# Patient Record
Sex: Female | Born: 1990 | Race: Black or African American | Hispanic: No | Marital: Single | Smoking: Never smoker
Health system: Southern US, Community
[De-identification: ages and names within clinical notes are randomized; demographics above are authoritative.]

## PROBLEM LIST (undated history)

## (undated) HISTORY — PX: NO PAST SURGERIES: SHX2092

---

## 2012-03-13 ENCOUNTER — Emergency Department (INDEPENDENT_AMBULATORY_CARE_PROVIDER_SITE_OTHER)
Admission: EM | Admit: 2012-03-13 | Discharge: 2012-03-13 | Disposition: A | Discharge status: Home / Self Care | Payer: PRIVATE HEALTH INSURANCE | Source: Home / Self Care | Attending: Family Medicine | Admitting: Family Medicine

## 2012-03-13 ENCOUNTER — Encounter (HOSPITAL_COMMUNITY): Payer: Self-pay | Admitting: Emergency Medicine

## 2012-03-13 DIAGNOSIS — L250 Unspecified contact dermatitis due to cosmetics: Secondary | ICD-10-CM

## 2012-03-13 MED ORDER — FLUTICASONE PROPIONATE 0.05 % EX CREA: TOPICAL_CREAM | Freq: Two times a day (BID) | CUTANEOUS | Status: DC

## 2012-03-13 NOTE — ED Notes (Signed)
Pt c/o rash x3 days... Rash is around bilateral eyes, behind left ear, and around mouth... Denies: fevers, vomiting, nauseas, diarrhea... Also denies: any new hygiene products, new foods... She is alert w/no signs of distress.

## 2012-03-13 NOTE — ED Provider Notes (Signed)
History     CSN: [id number]  Arrival date & time 03/13/12  0917   First MD Initiated Contact with Patient 03/13/12 [phone]      Chief Complaint  Patient presents with  . Rash    (Consider location/radiation/quality/duration/timing/severity/associated sxs/prior treatment) Patient is a 21 y.o. female presenting with rash. The history is provided by the patient.  Rash  This is a new problem. The current episode started 2 days ago. The problem has not changed since onset.The problem is associated with an unknown factor. There has been no fever. The rash is present on the face, lips and neck. The patient is experiencing no pain. The pain has been constant since onset. Associated symptoms include blisters and itching.    History reviewed. No pertinent past medical history.  History reviewed. No pertinent past surgical history.  No family history on file.  History  Substance Use Topics  . Smoking status: Never Smoker   . Smokeless tobacco: Not on file  . Alcohol Use: Yes    OB History    Grav Para Term Preterm Abortions TAB SAB Ect Mult Living                  Review of Systems  Constitutional: Negative.   HENT: Negative.   Skin: Positive for itching and rash.    Allergies  Review of patient's allergies indicates no known allergies.  Home Medications   Current Outpatient Rx  Name  Route  Sig  Dispense  Refill  . FLUTICASONE PROPIONATE 0.05 % EX CREA   Topical   Apply topically 2 (two) times daily. To rash areas   30 g   0     BP 99/61  Pulse 73  Temp 98.3 F (36.8 C) (Oral)  Resp 20  SpO2 97%  LMP 02/16/2012  Physical Exam  Vitals reviewed. Constitutional: She is oriented to person, place, and time. She appears well-developed and well-nourished.  HENT:  Head: Normocephalic.  Right Ear: External ear normal.  Mouth/Throat: Oropharynx is clear and moist.  Eyes: Pupils are equal, round, and reactive to light.  Neck: Normal range of motion. Neck supple.   Neurological: She is alert and oriented to person, place, and time.  Skin: Skin is warm and dry. Rash noted.       Fine pruritic papular/vesicular rash on upper lids, and lip and left neck.    ED Course  Procedures (including critical care time)  Labs Reviewed - No data to display No results found.   1. Contact dermatitis due to cosmetics       MDM          Linna Hoff, MD 03/13/12 1035

## 2012-05-08 ENCOUNTER — Emergency Department (INDEPENDENT_AMBULATORY_CARE_PROVIDER_SITE_OTHER)
Admission: EM | Admit: 2012-05-08 | Discharge: 2012-05-08 | Disposition: A | Discharge status: Home / Self Care | Payer: PRIVATE HEALTH INSURANCE | Source: Home / Self Care

## 2012-05-08 ENCOUNTER — Other Ambulatory Visit (HOSPITAL_COMMUNITY)
Admission: RE | Admit: 2012-05-08 | Discharge: 2012-05-08 | Disposition: A | Discharge status: Home / Self Care | Payer: PRIVATE HEALTH INSURANCE | Source: Ambulatory Visit | Attending: Emergency Medicine | Admitting: Emergency Medicine

## 2012-05-08 ENCOUNTER — Encounter (HOSPITAL_COMMUNITY): Payer: Self-pay

## 2012-05-08 DIAGNOSIS — N760 Acute vaginitis: Secondary | ICD-10-CM | POA: Insufficient documentation

## 2012-05-08 DIAGNOSIS — Z113 Encounter for screening for infections with a predominantly sexual mode of transmission: Secondary | ICD-10-CM | POA: Insufficient documentation

## 2012-05-08 DIAGNOSIS — A499 Bacterial infection, unspecified: Secondary | ICD-10-CM

## 2012-05-08 DIAGNOSIS — B9689 Other specified bacterial agents as the cause of diseases classified elsewhere: Secondary | ICD-10-CM

## 2012-05-08 LAB — POCT URINALYSIS DIP (DEVICE)
Bilirubin Urine: NEGATIVE
Glucose, UA: NEGATIVE mg/dL
Ketones, ur: 15 mg/dL — AB
Nitrite: NEGATIVE
Specific Gravity, Urine: 1.02 (ref 1.005–1.030)
pH: 7 (ref 5.0–8.0)

## 2012-05-08 MED ORDER — METRONIDAZOLE 500 MG PO TABS: 500.0000 mg | ORAL_TABLET | Freq: Two times a day (BID) | ORAL | Status: DC

## 2012-05-08 NOTE — ED Provider Notes (Signed)
Medical screening examination/treatment/procedure(s) were performed by resident physician or non-physician practitioner and as supervising physician I was immediately available for consultation/collaboration.   KINDL,JAMES DOUGLAS MD.    James D Kindl, MD 05/08/12 2031 

## 2012-05-08 NOTE — ED Provider Notes (Signed)
History     CSN: [id number]  Arrival date & time 05/08/12  1106   None     Chief Complaint  Patient presents with  . Vaginal Itching    (Consider location/radiation/quality/duration/timing/severity/associated sxs/prior treatment) HPI Comments: 22 year old female presents with vaginal irritation that began last night. She states that she uses condoms for contraception and alleges that she may have had an allergic reaction from that. However, she has been using condoms for over a couple years. She is complaining of itchiness in the perineum and vulva and she felt last night and she saw some redness. This morning she awoke and discovered scant amount of vaginal discharge. She has had minor and somewhat equivocal cramping in the suprapubis. She denies abdominal pain, fever, chills, chest pain, shortness of breath nausea or vomiting, diarrhea or urinary symptoms. Last menstrual period was in the last week of December.   History reviewed. No pertinent past medical history.  History reviewed. No pertinent past surgical history.  History reviewed. No pertinent family history.  History  Substance Use Topics  . Smoking status: Never Smoker   . Smokeless tobacco: Not on file  . Alcohol Use: Yes    OB History    Grav Para Term Preterm Abortions TAB SAB Ect Mult Living                  Review of Systems  Constitutional: Negative.   Respiratory: Negative.   Cardiovascular: Negative.   Gastrointestinal: Negative.   Genitourinary: Positive for vaginal discharge and vaginal pain. Negative for dysuria, urgency, frequency, hematuria, flank pain, vaginal bleeding, difficulty urinating and menstrual problem.  Musculoskeletal: Negative.   Skin: Negative.   Neurological: Negative.   Hematological: Negative.   Psychiatric/Behavioral: Negative.     Allergies  Review of patient's allergies indicates no known allergies.  Home Medications   Current Outpatient Rx  Name  Route  Sig   Dispense  Refill  . FLUTICASONE PROPIONATE 0.05 % EX CREA   Topical   Apply topically 2 (two) times daily. To rash areas   30 g   0   . METRONIDAZOLE 500 MG PO TABS   Oral   Take 1 tablet (500 mg total) by mouth 2 (two) times daily. X 7 days   14 tablet   0     BP 100/67  Pulse 80  Temp 97.3 F (36.3 C) (Oral)  Resp 18  SpO2 100%  Physical Exam  Nursing note and vitals reviewed. Constitutional: She is oriented to person, place, and time. She appears well-developed and well-nourished.  Eyes: Conjunctivae normal and EOM are normal.  Neck: Normal range of motion. Neck supple.  Cardiovascular: Normal rate.   Pulmonary/Chest: Effort normal.  Abdominal: Soft. Bowel sounds are normal. She exhibits no distension and no mass. There is no tenderness. There is no rebound and no guarding.  Genitourinary: Vaginal discharge found.       External exam of the pelvis reveals mild suprapubic tenderness. No tenderness over the bilateral pelvis. Normal external female genitalia. They are no observed lesions or discoloration of the vulva. The patient denies tenderness with palpation. Cervix is to the right of the midline and posterior. The ectocervix had patchy erythema but not friable. The os is nulliparous. There is a scant amount of thin gray vaginal discharge. Bimanual: No CMT or adnexal tenderness. AFirm andAptiva  swabs were obtained  Musculoskeletal: Normal range of motion. She exhibits no edema and no tenderness.  Neurological: She is alert and oriented  to person, place, and time.  Skin: Skin is warm. No rash noted.  Psychiatric: She has a normal mood and affect.    ED Course  Procedures (including critical care time)  Labs Reviewed  POCT URINALYSIS DIP (DEVICE) - Abnormal; Notable for the following:    Ketones, ur 15 (*)     Hgb urine dipstick TRACE (*)     Protein, ur 30 (*)     Leukocytes, UA TRACE (*)  Biochemical Testing Only. Please order routine urinalysis from main lab if  confirmatory testing is needed.   All other components within normal limits   No results found.   1. BV (bacterial vaginosis)   2. Vulvovaginitis       MDM  Flagyl 500 bid for 7 d. Obtain Affirm/Aptiva swabs. Will call results and treat accordingly.  May use Vagisil  OTC topically for discomfort. If persistent may try monistat cream.          Hayden Rasmussen, NP 05/08/12 1255

## 2012-05-08 NOTE — ED Notes (Signed)
C/o vaginal irritation & d/c since last PM; stated her perineal area was red when she looked w mirror; states episodic cramping lower abdominal area

## 2012-05-21 ENCOUNTER — Telehealth (HOSPITAL_COMMUNITY): Payer: Self-pay | Admitting: *Deleted

## 2012-05-21 NOTE — ED Notes (Signed)
2/4  GC/Chlamydia neg.,  Affirm: Candida pos., Gardnerella and Trich neg.  Message to Hayden Rasmussen NP.  He wrote for pt. to try OTC Monistat.  2/12 I called pt. Pt. verified x 2 and given results.  Pt. given instructions to use Monistat and how to use it.  Pt. voiced understanding. Vassie Moselle 05/21/2012

## 2014-11-26 ENCOUNTER — Emergency Department (HOSPITAL_COMMUNITY)
Admission: EM | Admit: 2014-11-26 | Discharge: 2014-11-27 | Disposition: A | Discharge status: Home / Self Care | Payer: No Typology Code available for payment source | Attending: Emergency Medicine | Admitting: Emergency Medicine

## 2014-11-26 ENCOUNTER — Encounter (HOSPITAL_COMMUNITY): Payer: Self-pay | Admitting: Emergency Medicine

## 2014-11-26 ENCOUNTER — Emergency Department (HOSPITAL_COMMUNITY): Payer: No Typology Code available for payment source

## 2014-11-26 DIAGNOSIS — S0990XA Unspecified injury of head, initial encounter: Secondary | ICD-10-CM | POA: Diagnosis not present

## 2014-11-26 DIAGNOSIS — Y998 Other external cause status: Secondary | ICD-10-CM | POA: Insufficient documentation

## 2014-11-26 DIAGNOSIS — M545 Low back pain: Secondary | ICD-10-CM

## 2014-11-26 DIAGNOSIS — Z79899 Other long term (current) drug therapy: Secondary | ICD-10-CM | POA: Diagnosis not present

## 2014-11-26 DIAGNOSIS — Y92410 Unspecified street and highway as the place of occurrence of the external cause: Secondary | ICD-10-CM | POA: Diagnosis not present

## 2014-11-26 DIAGNOSIS — Y9389 Activity, other specified: Secondary | ICD-10-CM | POA: Insufficient documentation

## 2014-11-26 DIAGNOSIS — S3992XA Unspecified injury of lower back, initial encounter: Secondary | ICD-10-CM | POA: Insufficient documentation

## 2014-11-26 DIAGNOSIS — S161XXA Strain of muscle, fascia and tendon at neck level, initial encounter: Secondary | ICD-10-CM | POA: Diagnosis not present

## 2014-11-26 DIAGNOSIS — S199XXA Unspecified injury of neck, initial encounter: Secondary | ICD-10-CM | POA: Diagnosis present

## 2014-11-26 MED ORDER — CYCLOBENZAPRINE HCL 5 MG PO TABS: 5.0000 mg | ORAL_TABLET | Freq: Three times a day (TID) | ORAL | Status: DC | PRN

## 2014-11-26 MED ORDER — HYDROCODONE-ACETAMINOPHEN 5-325 MG PO TABS
1.0000 | ORAL_TABLET | Freq: Once | ORAL | Status: AC
  Administered 2014-11-26: 1 via ORAL
  Filled 2014-11-26: qty 1

## 2014-11-26 MED ORDER — IBUPROFEN 600 MG PO TABS: 600.0000 mg | ORAL_TABLET | Freq: Three times a day (TID) | ORAL | Status: DC
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
## 2014-11-26 NOTE — ED Notes (Signed)
Patient transported to X-ray 

## 2015-03-29 LAB — OB RESULTS CONSOLE HIV ANTIBODY (ROUTINE TESTING): HIV: NONREACTIVE

## 2015-03-29 LAB — OB RESULTS CONSOLE RPR: RPR: NONREACTIVE

## 2015-03-29 LAB — OB RESULTS CONSOLE RUBELLA ANTIBODY, IGM: Rubella: IMMUNE

## 2015-03-29 LAB — OB RESULTS CONSOLE HEPATITIS B SURFACE ANTIGEN: Hepatitis B Surface Ag: NEGATIVE

## 2015-04-09 NOTE — L&D Delivery Note (Signed)
Called to room around midnight to evaluate pt due to an increasing amount of pressure and strong urge to push. Cvx noted to be 7/90/0 (no longer edematous). She was repositioned and peanut placed. Pitocin at 4 mU/min, reduced to 2 as MVUs were above 200 and ctxs were q 1-2 min. FHRT remained reassuring throughout. Pt visibly uncomfortable w/ each ctx. Cvx re-examined and noted to be AL/C/+1, dilating to 10 within minutes. Few elevated BPs, all taken during a ctx and pt's increased level of discomfort. She was repositioned in Lithotomy and formal pushing begun. Pt very warm to the touch, axillary temp 100.7 at 00:10. IV Unasyn 3 gm was given at 2238 --- will continue med after delivery for 24 hrs. H/A earlier in the evening relieved by Tylenol. Delivery occurred after only a few pushes. Details as follows:  Delivery Note At 1:03 AM a viable female "Layana" was delivered via Vaginal, Spontaneous Delivery (Presentation: OA restituting to Right Occiput Anterior). APGARS: 8, 9; weight 6 lbs 11.4 oz (3045 g).   Placenta status: Intact, Spontaneous Schultz. Cord: 3 vessels with the following complications: None.  Cord pH: NA  Anesthesia: Epidural  Episiotomy: None Lacerations: 2nd degree Vaginal/perineal Suture Repair: 3.0 chromic CT and SH Est. Blood Loss (mL): 300  Mom to postpartum.  Baby to Couplet care / Skin to Skin.  Mom plans to breastfeed.  Desires Mirena IUD for contraception.   Sherre Scarlet 10/28/2015, 2:21 AM

## 2015-04-13 LAB — OB RESULTS CONSOLE GC/CHLAMYDIA
CHLAMYDIA, DNA PROBE: NEGATIVE
GC PROBE AMP, GENITAL: NEGATIVE

## 2015-10-04 LAB — OB RESULTS CONSOLE GBS: GBS: NEGATIVE

## 2015-10-27 ENCOUNTER — Inpatient Hospital Stay (HOSPITAL_COMMUNITY): Payer: Medicaid Other | Admitting: Anesthesiology

## 2015-10-27 ENCOUNTER — Inpatient Hospital Stay (HOSPITAL_COMMUNITY)
Admission: AD | Admit: 2015-10-27 | Discharge: 2015-10-30 | DRG: 774 | Disposition: A | Discharge status: Home / Self Care | Payer: Medicaid Other | Source: Ambulatory Visit | Attending: Obstetrics and Gynecology | Admitting: Obstetrics and Gynecology

## 2015-10-27 ENCOUNTER — Encounter (HOSPITAL_COMMUNITY): Payer: Self-pay | Admitting: *Deleted

## 2015-10-27 DIAGNOSIS — Z3A39 39 weeks gestation of pregnancy: Secondary | ICD-10-CM | POA: Diagnosis not present

## 2015-10-27 DIAGNOSIS — O9832 Other infections with a predominantly sexual mode of transmission complicating childbirth: Secondary | ICD-10-CM | POA: Diagnosis present

## 2015-10-27 DIAGNOSIS — O411230 Chorioamnionitis, third trimester, not applicable or unspecified: Secondary | ICD-10-CM | POA: Diagnosis present

## 2015-10-27 DIAGNOSIS — IMO0001 Reserved for inherently not codable concepts without codable children: Secondary | ICD-10-CM

## 2015-10-27 DIAGNOSIS — A6000 Herpesviral infection of urogenital system, unspecified: Secondary | ICD-10-CM | POA: Diagnosis present

## 2015-10-27 LAB — CBC
HCT: 40.1 % (ref 36.0–46.0)
Hemoglobin: 13.8 g/dL (ref 12.0–15.0)
MCH: 33.6 pg (ref 26.0–34.0)
MCHC: 34.4 g/dL (ref 30.0–36.0)
MCV: 97.6 fL (ref 78.0–100.0)
Platelets: 192 10*3/uL (ref 150–400)
RBC: 4.11 MIL/uL (ref 3.87–5.11)
RDW: 13.3 % (ref 11.5–15.5)
WBC: 15.2 10*3/uL — ABNORMAL HIGH (ref 4.0–10.5)

## 2015-10-27 LAB — TYPE AND SCREEN
ABO/RH(D): O POS
Antibody Screen: NEGATIVE

## 2015-10-27 LAB — ABO/RH: ABO/RH(D): O POS

## 2015-10-27 MED ORDER — PHENYLEPHRINE 40 MCG/ML (10ML) SYRINGE FOR IV PUSH (FOR BLOOD PRESSURE SUPPORT): 80.0000 ug | PREFILLED_SYRINGE | INTRAVENOUS | Status: DC | PRN

## 2015-10-27 MED ORDER — EPHEDRINE 5 MG/ML INJ
10.0000 mg | INTRAVENOUS | Status: DC | PRN
  Filled 2015-10-27: qty 2

## 2015-10-27 MED ORDER — FENTANYL CITRATE (PF) 100 MCG/2ML IJ SOLN
50.0000 ug | INTRAMUSCULAR | Status: DC | PRN
  Administered 2015-10-27: 100 ug via INTRAVENOUS
  Filled 2015-10-27: qty 2

## 2015-10-27 MED ORDER — OXYCODONE-ACETAMINOPHEN 5-325 MG PO TABS: 2.0000 | ORAL_TABLET | ORAL | Status: DC | PRN

## 2015-10-27 MED ORDER — LIDOCAINE HCL (PF) 1 % IJ SOLN
30.0000 mL | INTRAMUSCULAR | Status: AC | PRN
  Administered 2015-10-28: 30 mL via SUBCUTANEOUS
  Filled 2015-10-27: qty 30

## 2015-10-27 MED ORDER — LACTATED RINGERS IV SOLN: 500.0000 mL | INTRAVENOUS | Status: DC | PRN

## 2015-10-27 MED ORDER — LACTATED RINGERS IV SOLN
500.0000 mL | Freq: Once | INTRAVENOUS | Status: AC
  Administered 2015-10-27: 500 mL via INTRAVENOUS

## 2015-10-27 MED ORDER — LACTATED RINGERS IV SOLN
INTRAVENOUS | Status: DC
Start: 2015-10-27 — End: 2015-10-28
  Administered 2015-10-27 (×3): via INTRAVENOUS

## 2015-10-27 MED ORDER — LIDOCAINE HCL (PF) 1 % IJ SOLN
INTRAMUSCULAR | Status: DC | PRN
  Administered 2015-10-27 (×2): 4 mL via EPIDURAL

## 2015-10-27 MED ORDER — TERBUTALINE SULFATE 1 MG/ML IJ SOLN
0.2500 mg | Freq: Once | INTRAMUSCULAR | Status: DC | PRN
  Filled 2015-10-27: qty 1

## 2015-10-27 MED ORDER — OXYTOCIN BOLUS FROM INFUSION
500.0000 mL | INTRAVENOUS | Status: DC
  Administered 2015-10-28: 500 mL via INTRAVENOUS

## 2015-10-27 MED ORDER — SOD CITRATE-CITRIC ACID 500-334 MG/5ML PO SOLN: 30.0000 mL | ORAL | Status: DC | PRN

## 2015-10-27 MED ORDER — ACETAMINOPHEN 325 MG PO TABS
650.0000 mg | ORAL_TABLET | ORAL | Status: DC | PRN
  Administered 2015-10-27: 650 mg via ORAL
  Filled 2015-10-27: qty 2

## 2015-10-27 MED ORDER — LACTATED RINGERS IV SOLN
INTRAVENOUS | Status: DC
  Administered 2015-10-27: 250 mL via INTRAUTERINE

## 2015-10-27 MED ORDER — EPHEDRINE 5 MG/ML INJ: 10.0000 mg | INTRAVENOUS | Status: DC | PRN

## 2015-10-27 MED ORDER — PHENYLEPHRINE 40 MCG/ML (10ML) SYRINGE FOR IV PUSH (FOR BLOOD PRESSURE SUPPORT)
80.0000 ug | PREFILLED_SYRINGE | INTRAVENOUS | Status: DC | PRN
  Filled 2015-10-27: qty 10
  Filled 2015-10-27: qty 5

## 2015-10-27 MED ORDER — OXYTOCIN 40 UNITS IN LACTATED RINGERS INFUSION - SIMPLE MED
1.0000 m[IU]/min | INTRAVENOUS | Status: DC
  Administered 2015-10-27: 1 m[IU]/min via INTRAVENOUS

## 2015-10-27 MED ORDER — FENTANYL CITRATE (PF) 100 MCG/2ML IJ SOLN
100.0000 ug | Freq: Once | INTRAMUSCULAR | Status: AC
  Administered 2015-10-27: 100 ug via INTRAVENOUS
  Filled 2015-10-27: qty 2

## 2015-10-27 MED ORDER — FENTANYL 2.5 MCG/ML BUPIVACAINE 1/10 % EPIDURAL INFUSION (WH - ANES)
14.0000 mL/h | INTRAMUSCULAR | Status: DC | PRN
  Administered 2015-10-27 (×2): 14 mL/h via EPIDURAL
  Filled 2015-10-27 (×2): qty 125

## 2015-10-27 MED ORDER — OXYTOCIN 40 UNITS IN LACTATED RINGERS INFUSION - SIMPLE MED
INTRAVENOUS | Status: AC
  Filled 2015-10-27: qty 1000

## 2015-10-27 MED ORDER — SODIUM CHLORIDE 0.9 % IV SOLN
3.0000 g | Freq: Four times a day (QID) | INTRAVENOUS | Status: AC
  Administered 2015-10-27 – 2015-10-28 (×4): 3 g via INTRAVENOUS
  Filled 2015-10-27 (×4): qty 3

## 2015-10-27 MED ORDER — OXYCODONE-ACETAMINOPHEN 5-325 MG PO TABS: 1.0000 | ORAL_TABLET | ORAL | Status: DC | PRN

## 2015-10-27 MED ORDER — PHENYLEPHRINE 40 MCG/ML (10ML) SYRINGE FOR IV PUSH (FOR BLOOD PRESSURE SUPPORT)
80.0000 ug | PREFILLED_SYRINGE | INTRAVENOUS | Status: DC | PRN
  Filled 2015-10-27: qty 5

## 2015-10-27 MED ORDER — LACTATED RINGERS IV SOLN: 500.0000 mL | Freq: Once | INTRAVENOUS | Status: DC

## 2015-10-27 MED ORDER — FLEET ENEMA 7-19 GM/118ML RE ENEM: 1.0000 | ENEMA | RECTAL | Status: DC | PRN

## 2015-10-27 MED ORDER — DIPHENHYDRAMINE HCL 50 MG/ML IJ SOLN
12.5000 mg | INTRAMUSCULAR | Status: DC | PRN
  Administered 2015-10-27: 12.5 mg via INTRAVENOUS
  Filled 2015-10-27: qty 1

## 2015-10-27 MED ORDER — ONDANSETRON HCL 4 MG/2ML IJ SOLN: 4.0000 mg | Freq: Four times a day (QID) | INTRAMUSCULAR | Status: DC | PRN

## 2015-10-27 MED ORDER — OXYTOCIN 40 UNITS IN LACTATED RINGERS INFUSION - SIMPLE MED
2.5000 [IU]/h | INTRAVENOUS | Status: DC
  Administered 2015-10-28: 2.5 [IU]/h via INTRAVENOUS

## 2015-10-27 NOTE — Anesthesia Procedure Notes (Signed)
Epidural Patient location during procedure: OB Start time: 10/27/2015 12:20 PM End time: 10/27/2015 12:27 PM  Staffing Anesthesiologist: Shona SimpsonHOLLIS, Katelyn Blasing D Performed by: anesthesiologist   Preanesthetic Checklist Completed: patient identified, site marked, surgical consent, pre-op evaluation, timeout performed, IV checked, risks and benefits discussed and monitors and equipment checked  Epidural Patient position: sitting Prep: ChloraPrep Patient monitoring: heart rate, continuous pulse ox and blood pressure Approach: midline Location: L3-L4 Injection technique: LOR saline  Needle:  Needle type: Tuohy  Needle gauge: 17 G Needle length: 9 cm Catheter type: closed end flexible Catheter size: 20 Guage Test dose: negative and 1.5% lidocaine  Assessment Events: blood not aspirated, injection not painful, no injection resistance and no paresthesia  Additional Notes LOR @ 5  Patient identified. Risks/Benefits/Options discussed with patient including but not limited to bleeding, infection, nerve damage, paralysis, failed block, incomplete pain control, headache, blood pressure changes, nausea, vomiting, reactions to medications, itching and postpartum back pain. Confirmed with bedside nurse the patient's most recent platelet count. Confirmed with patient that they are not currently taking any anticoagulation, have any bleeding history or any family history of bleeding disorders. Patient expressed understanding and wished to proceed. All questions were answered. Sterile technique was used throughout the entire procedure. Please see nursing notes for vital signs. Test dose was given through epidural catheter and negative prior to continuing to dose epidural or start infusion. Warning signs of high block given to the patient including shortness of breath, tingling/numbness in hands, complete motor block, or any concerning symptoms with instructions to call for help. Patient was given instructions on  fall risk and not to get out of bed. All questions and concerns addressed with instructions to call with any issues or inadequate analgesia.    Reason for block:procedure for pain

## 2015-10-27 NOTE — Progress Notes (Signed)
Report given to Devra DoppAmber Knox, RN. Relinquished care of patient.

## 2015-10-27 NOTE — Anesthesia Pain Management Evaluation Note (Signed)
  CRNA Pain Management Visit Note  Patient: Katelyn Warner, 25 y.o., female  "Hello I am a member of the anesthesia team at [facility]. We have an anesthesia team available at all times to provide care throughout the hospital, including epidural management and anesthesia for C-section. I don't know your plan for the delivery whether it a natural birth, water birth, IV sedation, nitrous supplementation, doula or epidural, but we want to meet your pain goals."   1.Was your pain managed to your expectations on prior hospitalizations?   No prior hospitalizations  2.What is your expectation for pain management during this hospitalization?     Epidural and IV pain meds  3.How can we help you reach that goal? IV pain meds, epidural.  L&D RN at bedside and preparing to administer IV pain meds.  Patient given extensive information on epidural by CRNA.  Record the patient's initial score and the patient's pain goal.   Pain: 7  Pain Goal: 8 [facility] wants you to be able to say your pain was always managed very well.  Rosaura Bolon L 10/27/2015
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
## 2015-10-27 NOTE — H&P (Signed)
Katelyn Warner is a [age] y.o. 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  Normal anatomy scan   OB History    Gravida Para Term Preterm AB TAB SAB Ectopic Multiple Living   1              History reviewed. No pertinent past medical history. History reviewed. No pertinent past surgical history. Family History: family history is not on file. Social History:  reports that she has never smoked. She does not have any smokeless tobacco history on file. She reports that she drinks alcohol. She reports that she does not use illicit drugs.   Prenatal Transfer Tool  Maternal Diabetes: No Genetic Screening: Normal Maternal Ultrasounds/Referrals: Normal Fetal Ultrasounds or other Referrals:  None Maternal Substance Abuse:  No Significant Maternal Medications:  Meds include: Other: valtrex Significant Maternal Lab Results: Lab values include: Other: hsv 2 positive   ROS:  Positive for abdominal pain (contractions) negative for all other systems  No Known Allergies   Dilation: 2 Effacement (%): 70 Station: -3 Exam by:: L. Clemmons CNM Blood pressure 135/79, pulse 99, temperature 97.2 F (36.2 C), temperature source Oral, resp. rate 16, last menstrual period 11/25/2014.  Chest clear Heart RRR without murmur Abd gravid, NT Pelvic: 2-3/70/-2 VTX; SROM questionable meconium staining   FHR: Category 1 UCs: 2 minutes moderate  Prenatal labs: ABO, Rh:  O+ Antibody:   Rubella:  !Error!Immune RPR:    HBsAg:   Neg HIV:   Neg GBS:  Neg  Assessment/Plan: IUP at 39+3 HSV 2  [facility] consult with Dr. Idamae SchullerVarnardo Routine [facility] orders Pain med/epidural prn   Lori A ClemmonsCNM, [place] 10/27/2015, 8:12 AM

## 2015-10-27 NOTE — Progress Notes (Signed)
Notified of pt cervical change and non reactive tracing. Requested CNM review tracing. CNM gave orders for IV bolus and recheck in another hour

## 2015-10-27 NOTE — Anesthesia Preprocedure Evaluation (Signed)
Anesthesia Evaluation  Patient identified by MRN, date of birth, ID band Patient awake    Reviewed: Allergy & Precautions, NPO status , Patient's Chart, lab work & pertinent test results  Airway Mallampati: II       Dental  (+) Teeth Intact   Pulmonary neg pulmonary ROS,    breath sounds clear to auscultation       Cardiovascular negative cardio ROS   Rhythm:Regular Rate:Normal     Neuro/Psych negative neurological ROS  negative psych ROS   GI/Hepatic negative GI ROS, Neg liver ROS,   Endo/Other  negative endocrine ROS  Renal/GU negative Renal ROS  negative genitourinary   Musculoskeletal negative musculoskeletal ROS (+)   Abdominal   Peds negative pediatric ROS (+)  Hematology negative hematology ROS (+)   Anesthesia Other Findings   Reproductive/Obstetrics (+) Pregnancy                             Lab Results  Component Value Date   WBC 15.2* 10/27/2015   HGB 13.8 10/27/2015   HCT 40.1 10/27/2015   MCV 97.6 10/27/2015   PLT 192 10/27/2015   No results found for: CREATININE, BUN, NA, K, CL, CO2 No results found for: INR, PROTIME   Anesthesia Physical Anesthesia Plan  ASA: II  Anesthesia Plan: Epidural   Post-op Pain Management:    Induction:   Airway Management Planned:   Additional Equipment:   Intra-op Plan:   Post-operative Plan:   Informed Consent: I have reviewed the patients History and Physical, chart, labs and discussed the procedure including the risks, benefits and alternatives for the proposed anesthesia with the patient or authorized representative who has indicated his/her understanding and acceptance.     Plan Discussed with:   Anesthesia Plan Comments:         Anesthesia Quick Evaluation

## 2015-10-27 NOTE — Progress Notes (Signed)
Maree ErieLeah Dinse is a 25 y.o. G1P0 at 6153w3d   Subjective: Comfortable with epidural  Objective: BP 95/61 mmHg  Pulse 111  Temp(Src) 99 F (37.2 C) (Oral)  Resp 18  Ht 5\' 2"  (1.575 m)  Wt 167 lb (75.751 kg)  BMI 30.54 kg/m2  SpO2 100%  LMP 11/25/2014   Total I/O In: -  Out: 1200 [Urine:1200]  FHT:  FHR: 140s bpm, variability: moderate,  accelerations:  Present (small),  decelerations:  Absent UC:   regular, every q2-4 minutes SVE:   Dilation: 6.5 Effacement (%): 70 Station: 0 Exam by:: Su Hiltoberts, MD  Labs: Lab Results  Component Value Date   WBC 15.2* 10/27/2015   HGB 13.8 10/27/2015   HCT 40.1 10/27/2015   MCV 97.6 10/27/2015   PLT 192 10/27/2015    Assessment / Plan: Augmentation of labor, progressing well  Labor: Progressing normally Preeclampsia:  no signs or symptoms of toxicity Fetal Wellbeing:  Category I Pain Control:  Epidural I/D:  GBS neg Anticipated MOD:  NSVD  Kristi Hyer Y 10/27/2015, 6:07 PM

## 2015-10-27 NOTE — Progress Notes (Signed)
Initial call from [facility] was to notify that pt was here. Told charge nurse to treat pt as a labor check and to recheck in one hour. Received call from RN that pt had bloody show and slight cervical change. Stated that the strip had some periods of being "flat". Orders given to start IV and give IV bolus and recheck cervix and recheck in 1 hour. Called and told that the pt FHR strip "did not look good". Reviewed strip immediately and found strip to be reactive Category 1 with occasional mild variable. Reassured nurses that strip was reassuring. On cervical exam, pt SROMed questionable meconium stained fluid. Plan to admit.

## 2015-10-27 NOTE — Progress Notes (Signed)
Katelyn Warner is a 25 y.o. G1P0 at 8627w3d   Subjective: No complaints  Objective: BP 116/66 mmHg  Pulse 105  Temp(Src) 99.5 F (37.5 C) (Axillary)  Resp 18  Ht 5\' 2"  (1.575 m)  Wt 167 lb (75.751 kg)  BMI 30.54 kg/m2  SpO2 100%  LMP 11/25/2014   Total I/O In: -  Out: 1200 [Urine:1200]  FHT:  FHR: 140 bpm, variability: min-mod,  accelerations:  absent,  decelerations:  Absent UC:   Coupling every 5 min SVE:   Dilation: 5 Effacement (%): 70 Station: -2 Exam by:: M.Merrill, RN  Labs: Lab Results  Component Value Date   WBC 15.2* 10/27/2015   HGB 13.8 10/27/2015   HCT 40.1 10/27/2015   MCV 97.6 10/27/2015   PLT 192 10/27/2015    Assessment / Plan: Augmentation of labor, progressing well  Labor: Progressing normally Preeclampsia:  no signs or symptoms of toxicity Fetal Wellbeing:  Category I Pain Control:  Epidural I/D:  GBS neg Anticipated MOD:  NSVD  Katelyn Warner Y 10/27/2015, 4:53 PM

## 2015-10-27 NOTE — Progress Notes (Signed)
Katelyn Warner is a 25 y.o. G1P0 at 8422w3d admitted for labor and ROM  Subjective: Comfortable s/p epidural.  Feels some pressure.  Objective: BP 130/68 mmHg  Pulse 129  Temp(Src) 98.5 F (36.9 C) (Oral)  Resp 18  Ht 5\' 2"  (1.575 m)  Wt 167 lb (75.751 kg)  BMI 30.54 kg/m2  SpO2 100%  LMP 11/25/2014     FHT:  FHR: 150 bpm, variability: minimal ,  accelerations:  Present,  decelerations:  Present occas decel  UC:   regular, every 2-5 minutes SVE:   Dilation: 4 Effacement (%): 80 Station: -2 Exam by:: Dr. Su Hiltoberts IUPC placed  Labs: Lab Results  Component Value Date   WBC 15.2* 10/27/2015   HGB 13.8 10/27/2015   HCT 40.1 10/27/2015   MCV 97.6 10/27/2015   PLT 192 10/27/2015    Assessment / Plan: labor with SROM lt meconium  Labor: will augment with pitocin if ctxs not adequate Preeclampsia:  no signs or symptoms of toxicity Fetal Wellbeing:  Category II Pain Control:  Epidural I/D:  GBS neg Anticipated MOD:  NSVD  Katelyn Warner Y 10/27/2015, 3:16 PM

## 2015-10-27 NOTE — Progress Notes (Signed)
Notified of pt unchanged cervical exam but increased bloody show and persistent non reactive NST despite IV fluids and position changes. Will review strip and come see pt

## 2015-10-27 NOTE — Progress Notes (Addendum)
Assuming care of Makenzee D. Hightower, 25 yo G1P0 @ 39.3 wks admitted in latent labor and for ? SROM at 0657 this morning, fluid possibly light mec stained. Family at bedside.   Subjective: Feeling pressure w/ each ctx and ? hunger h/a, o/w comfortable w/ epidural. +FM. Denies VB. +LOF.  Objective: BP 104/47 mmHg  Pulse 126  Temp(Src) 99.6 F (37.6 C) (Oral)  Resp 18  Ht 5\' 2"  (1.575 m)  Wt 75.751 kg (167 lb)  BMI 30.54 kg/m2  SpO2 100%  LMP 11/25/2014 I/O last 3 completed shifts: In: -  Out: 1200 [Urine:1200] Today's Vitals   10/27/15 2101 10/27/15 2131 10/27/15 2201 10/27/15 2202  BP: 125/74 125/74  104/47  Pulse: 151 124  126  Temp:   99.6 F (37.6 C)   TempSrc:   Oral   Resp: 18 18  18   Height:      Weight:      SpO2:      PainSc: 0-No pain   0-No pain   Tmax 99.6.  Gen: Mild distress. Skin: Warm to the touch. Lungs: CTAB. CV: RRR w/o M/R/G. Abdomen: gravid, soft btw ctxs, NT. Cephalic by Leopold's and VE. EFW: 6 1/4 lbs. Ext: Calf/ankle edema present. FHT: BL 150 bpm @ 19:00, increasing to 160 bpm at 20:12, lasting until 22:27. Min variability, but +scalp stim w/ exam and mod variability w/ IVFs. +Earlys, no lates. UC:   irregular, every 1-2 minutes, MVUs 175. SVE:   Dilation: 6.5 Effacement (%):  (swelling) Station: 0 Exam by:: k.Berkleigh Beckles@ 2111. No lesions noted to external genitalia, vagina or rectum.  Pitocin at 3 mU/min as of 2113.  Assessment:  IUP at term. Protracted active phase. ROM x 15 hrs - maternal tachycardia, warm skin, spike in temp and BL FHR 160 x 2 hrs in spite of Tylenol and IVF bolus - suspect chorio - fluid w/o odor. Uterine activity appears tachysystole, however FHRT remains reassuring. Labor inadequate. GBS neg. Genital herpes simplex.  Plan: Explained above concerns. Pt in agreement w/ beginning IV Unasyn prophylactically; NKDA confirmed. Monitor closely. Utilize intrauterine resuscitative measures prn. Continue  amnioinfusion. Continue to titrate Pitocin to adequate labor. Consult as indicated.   Sherre ScarletWILLIAMS, Zadiel Leyh CNM 10/27/2015, 10:24 PM

## 2015-10-28 ENCOUNTER — Encounter (HOSPITAL_COMMUNITY): Payer: Self-pay

## 2015-10-28 LAB — CBC
HEMATOCRIT: 34.2 % — AB (ref 36.0–46.0)
Hemoglobin: 11.9 g/dL — ABNORMAL LOW (ref 12.0–15.0)
MCH: 33.8 pg (ref 26.0–34.0)
MCHC: 34.8 g/dL (ref 30.0–36.0)
MCV: 97.2 fL (ref 78.0–100.0)
Platelets: 189 10*3/uL (ref 150–400)
RBC: 3.52 MIL/uL — AB (ref 3.87–5.11)
RDW: 13.3 % (ref 11.5–15.5)
WBC: 23.5 10*3/uL — AB (ref 4.0–10.5)

## 2015-10-28 LAB — RPR: RPR Ser Ql: NONREACTIVE

## 2015-10-28 MED ORDER — PRENATAL MULTIVITAMIN CH
1.0000 | ORAL_TABLET | Freq: Every day | ORAL | Status: DC
  Administered 2015-10-28 – 2015-10-29 (×2): 1 via ORAL
  Filled 2015-10-28 (×2): qty 1

## 2015-10-28 MED ORDER — OXYCODONE-ACETAMINOPHEN 5-325 MG PO TABS: 2.0000 | ORAL_TABLET | ORAL | Status: DC | PRN

## 2015-10-28 MED ORDER — ONDANSETRON HCL 4 MG/2ML IJ SOLN: 4.0000 mg | INTRAMUSCULAR | Status: DC | PRN

## 2015-10-28 MED ORDER — ONDANSETRON HCL 4 MG PO TABS: 4.0000 mg | ORAL_TABLET | ORAL | Status: DC | PRN

## 2015-10-28 MED ORDER — DIBUCAINE 1 % RE OINT: 1.0000 "application " | TOPICAL_OINTMENT | RECTAL | Status: DC | PRN

## 2015-10-28 MED ORDER — ACETAMINOPHEN 325 MG PO TABS
650.0000 mg | ORAL_TABLET | ORAL | Status: DC | PRN
Start: 2015-10-28 — End: 2015-10-30
  Administered 2015-10-28 – 2015-10-30 (×2): 650 mg via ORAL
  Filled 2015-10-28 (×2): qty 2

## 2015-10-28 MED ORDER — WITCH HAZEL-GLYCERIN EX PADS: 1.0000 "application " | MEDICATED_PAD | CUTANEOUS | Status: DC | PRN

## 2015-10-28 MED ORDER — ZOLPIDEM TARTRATE 5 MG PO TABS: 5.0000 mg | ORAL_TABLET | Freq: Every evening | ORAL | Status: DC | PRN

## 2015-10-28 MED ORDER — COCONUT OIL OIL: 1.0000 "application " | TOPICAL_OIL | Status: DC | PRN

## 2015-10-28 MED ORDER — METHYLERGONOVINE MALEATE 0.2 MG PO TABS: 0.2000 mg | ORAL_TABLET | ORAL | Status: DC | PRN

## 2015-10-28 MED ORDER — METHYLERGONOVINE MALEATE 0.2 MG/ML IJ SOLN: 0.2000 mg | INTRAMUSCULAR | Status: DC | PRN

## 2015-10-28 MED ORDER — BENZOCAINE-MENTHOL 20-0.5 % EX AERO
1.0000 "application " | INHALATION_SPRAY | CUTANEOUS | Status: DC | PRN
  Administered 2015-10-28: 1 via TOPICAL
  Filled 2015-10-28: qty 56

## 2015-10-28 MED ORDER — DIPHENHYDRAMINE HCL 25 MG PO CAPS: 25.0000 mg | ORAL_CAPSULE | Freq: Four times a day (QID) | ORAL | Status: DC | PRN

## 2015-10-28 MED ORDER — SENNOSIDES-DOCUSATE SODIUM 8.6-50 MG PO TABS
2.0000 | ORAL_TABLET | ORAL | Status: DC
  Administered 2015-10-29: 2 via ORAL
  Filled 2015-10-28: qty 2

## 2015-10-28 MED ORDER — IBUPROFEN 600 MG PO TABS
600.0000 mg | ORAL_TABLET | Freq: Four times a day (QID) | ORAL | Status: DC
  Administered 2015-10-28 – 2015-10-30 (×10): 600 mg via ORAL
  Filled 2015-10-28 (×10): qty 1

## 2015-10-28 MED ORDER — OXYCODONE-ACETAMINOPHEN 5-325 MG PO TABS: 1.0000 | ORAL_TABLET | ORAL | Status: DC | PRN

## 2015-10-28 MED ORDER — FERROUS SULFATE 325 (65 FE) MG PO TABS
325.0000 mg | ORAL_TABLET | Freq: Two times a day (BID) | ORAL | Status: DC
  Administered 2015-10-28 – 2015-10-30 (×5): 325 mg via ORAL
  Filled 2015-10-28 (×5): qty 1

## 2015-10-28 MED ORDER — TETANUS-DIPHTH-ACELL PERTUSSIS 5-2.5-18.5 LF-MCG/0.5 IM SUSP: 0.5000 mL | Freq: Once | INTRAMUSCULAR | Status: DC

## 2015-10-28 MED ORDER — SIMETHICONE 80 MG PO CHEW: 80.0000 mg | CHEWABLE_TABLET | ORAL | Status: DC | PRN

## 2015-10-28 NOTE — Lactation Note (Signed)
This note was copied from a baby's chart. Lactation Consultation Note  Patient Name: Katelyn Warner [id number] Date: 10/28/2015 Reason for consult: Initial assessment  Mom seen for initial LC visit.  RN had provided a size 20 nipple shield. Infant was attempting to latch, but was not effectively using the nipple shield. A small amount of formula was used to prefill the nipple shield (w/curved-tip syringe). Infant would suckle & swallow formula, but would not consistently maintain suckling. Mom encouraged to continue trying, but to call RN if more formula needed for prefilling syringe. A size 24 nipple shield was briefly tried, but the size 20 was the more effective fit.   Mom reports + breast changes w/pregnancy. Mom made aware of O/P services, breastfeeding support groups, community resources, and our phone # for post-discharge questions.    Lurline Hare [facility] 10/28/2015, 10:21 PM

## 2015-10-28 NOTE — Progress Notes (Signed)
Gave #20 nipple shield for short shaft, almost flat nipples. Infant able to latch much better with this. Educated MOB about use, cleaning and maintenance of nipple shield. Encouraged to call out with questions/concerns. Sherald Barge

## 2015-10-28 NOTE — Progress Notes (Signed)
Subjective: Postpartum Day 1: Vaginal delivery, 2nd degree laceration Patient up ad lib, reports no syncope or dizziness. Feeding: Breast Contraceptive plan: Possible IUD  Objective: Vital signs in last 24 hours: Temp:  [97.6 F (36.4 C)-100.7 F (38.2 C)] 97.7 F (36.5 C) (07/22 1821) Pulse Rate:  [77-151] 77 (07/22 1821) Resp:  [18] 18 (07/22 1821) BP: (99-161)/(47-103) 114/65 mmHg (07/22 1821) SpO2:  [98 %-100 %] 100 % (07/22 1821)  Physical Exam:  General: alert, cooperative and no distress Lochia: appropriate Uterine Fundus: firm Perineum: healing well DVT Evaluation: No evidence of DVT seen on physical exam. Negative Homan's sign. No cords or calf tenderness. No significant calf/ankle edema.   CBC Latest Ref Rng 10/28/2015 10/27/2015  WBC 4.0 - 10.5 K/uL 23.5(H) 15.2(H)  Hemoglobin 12.0 - 15.0 g/dL 11.9(L) 13.8  Hematocrit 36.0 - 46.0 % 34.2(L) 40.1  Platelets 150 - 400 K/uL 189 192     Assessment/Plan: Status post vaginal delivery day 1. Normal involution. Repeat CBC in am due to elevated white count of 23.5, up from 15.2. Was treated w/ Unasyn during labor and in the immediate pp period for suspected chorio. Continue current care. Plan for discharge tomorrow and Contraception possible Mirena IUD    Robyne Askew 10/29/15, [phone]

## 2015-10-28 NOTE — Anesthesia Postprocedure Evaluation (Signed)
Anesthesia Post Note  Patient: Katelyn Warner  Procedure(s) Performed: * No procedures listed *  Patient location during evaluation: Mother Baby Anesthesia Type: Epidural Level of consciousness: awake Pain management: satisfactory to patient Vital Signs Assessment: post-procedure vital signs reviewed and stable Respiratory status: spontaneous breathing Cardiovascular status: stable Anesthetic complications: no     Last Vitals:  Filed Vitals:   10/28/15 0427 10/28/15 0900  BP: 116/67 121/88  Pulse: 127 88  Temp: 36.9 C 36.4 C  Resp: 18 18    Last Pain:  Filed Vitals:   10/28/15 0919  PainSc: 0-No pain   Pain Goal: Patients Stated Pain Goal: 8 (10/27/15 1208)               Cephus Shelling

## 2015-10-29 NOTE — Lactation Note (Signed)
This note was copied from a baby's chart. Lactation Consultation Note  Patient Name: Girl Jovani Entwistle JGGEZ'M Date: 10/29/2015 Reason for consult: Follow-up assessment;Difficult latch Mom called for assist to try 5 fr feeding tube/syringe system to supplement at breast. Mom latched baby to left breast using 20 nipple shield. LC adjusted position for more depth and reviewed un-tucking the lower lip. Demonstrated set up of 72fr feeding tube/syringe to supplement at breast, baby took supplement well with this system. Cleaning reviewed with parents after BF. Baby became sleepy after the supplement at breast. While cleaning equipment and discussing plan with Mom, baby began rooting and Mom wanted to see if baby could latch without the nipple shield. After few attempts and using breast compression baby did latch, demonstrating some good suckling bursts. Plan discussed with parents is to pre-pump to help with latch.  Try to latch without the nipple shield but if baby frustrated/not latching, use 20 nipple shield.  Try not to give supplement at beginning of feeding. Try to BF on 1st breast for 15-20 minutes, then switch to 2nd breast giving supplement with baby nursing on 2nd breast. Alternate this pattern each feeding. We want to make sure baby is transferring milk at breast. Look for breast milk in nipple shield. If baby is nursing with or without the nipple shield and satisfied at breast without supplement, parents don't have to supplement each feeding unless becomes medically necessary. Post pump for 15 minutes after feeding, Mom plans to use manual pump for now. Mom had DEBP at home.  Call for assist as needed.  Maternal Data    Feeding Feeding Type: Breast Fed Length of feed: 15 min  LATCH Score/Interventions Latch: Repeated attempts needed to sustain latch, nipple held in mouth throughout feeding, stimulation needed to elicit sucking reflex. (re-latched to left breast w/o NS) Intervention(s): Adjust  position;Assist with latch;Breast massage;Breast compression  Audible Swallowing: A few with stimulation  Type of Nipple: Flat Intervention(s): Hand pump  Comfort (Breast/Nipple): Soft / non-tender     Hold (Positioning): Assistance needed to correctly position infant at breast and maintain latch.  LATCH Score: 6  Lactation Tools Discussed/Used Tools: 62F feeding tube / Syringe Nipple shield size: 20 Breast pump type: Manual   Consult Status Consult Status: Follow-up Date: 10/30/15 Follow-up type: In-patient    Alfred Levins 10/29/2015, 11:38 PM

## 2015-10-29 NOTE — Lactation Note (Signed)
This note was copied from a baby's chart. Lactation Consultation Note  Patient Name: Katelyn Warner TRRNH'A Date: 10/29/2015 Reason for consult: Follow-up assessment;Difficult latch Mom reports baby is latching with #20 nipple shield. She is pre-filling nipple shield with formula to keep baby nursing. Mom reports she takes baby on/off the breast to pre-fill nipple shield. . Discussed trying 5 fr feeding tube/syringe at breast to keep baby latched with feedings. Mom is interested. LC left phone number for Mom to call with next feeding for [facility] assist.  Encouraged Mom to post pump after BF, she will use manual pump for now. Mom reports having Medela freestyle pump at home. Mom to call.   Maternal Data    Feeding Feeding Type: Breast Fed Length of feed: 20 min  LATCH Score/Interventions                      Lactation Tools Discussed/Used Tools: Nipple Dorris Carnes;Pump Nipple shield size: 20 Breast pump type: Manual   Consult Status Consult Status: Follow-up Date: 10/29/15 Follow-up type: In-patient    Alfred Levins 10/29/2015, 9:11 PM

## 2015-10-30 LAB — CBC
HEMATOCRIT: 30.8 % — AB (ref 36.0–46.0)
Hemoglobin: 10.6 g/dL — ABNORMAL LOW (ref 12.0–15.0)
MCH: 33.3 pg (ref 26.0–34.0)
MCHC: 34.4 g/dL (ref 30.0–36.0)
MCV: 96.9 fL (ref 78.0–100.0)
Platelets: 163 10*3/uL (ref 150–400)
RBC: 3.18 MIL/uL — ABNORMAL LOW (ref 3.87–5.11)
RDW: 13.1 % (ref 11.5–15.5)
WBC: 14.3 10*3/uL — ABNORMAL HIGH (ref 4.0–10.5)

## 2015-10-30 MED ORDER — IBUPROFEN 600 MG PO TABS: 600.0000 mg | ORAL_TABLET | Freq: Four times a day (QID) | ORAL | 2 refills | Status: DC

## 2015-10-30 NOTE — Lactation Note (Signed)
This note was copied from a baby's chart. Lactation Consultation Note Mom has 6.0lb baby having 8% weight loss. Mom shown how to use DEBP & how to disassemble, clean, & reassemble parts.Mom knows to pump q3h for 15-20 min.Mom encouraged to feed baby 8-12 times/24 hours and with feeding cues. Mom shown how to nurse baby in laid-back nursing position. educated about newborn behaviorMom placed in shells and encouraged to wear them between feedings. Mom encouraged to do skin-to-skin. Noted limited tongue movement, has cupping of mid-tongue. Reported to CN and Charity fundraiser. Discussed supplementing in 5 Fr in NS.  Patient Name: Katelyn Warner Date: 10/30/2015 Reason for consult: Follow-up assessment;Difficult latch   Maternal Data    Feeding Feeding Type: Breast Milk with Formula added Length of feed: 25 min  LATCH Score/Interventions Latch: Grasps breast easily, tongue down, lips flanged, rhythmical sucking. Intervention(s): Skin to skin;Teach feeding cues;Waking techniques Intervention(s): Breast massage;Breast compression  Audible Swallowing: A few with stimulation Intervention(s): Skin to skin;Hand expression Intervention(s): Alternate breast massage  Type of Nipple: Everted at rest and after stimulation Intervention(s): Shells;Hand pump;Double electric pump  Comfort (Breast/Nipple): Filling, red/small blisters or bruises, mild/mod discomfort  Problem noted: Mild/Moderate discomfort Interventions (Mild/moderate discomfort): Post-pump;Breast shields;Hand expression;Hand massage  Hold (Positioning): Assistance needed to correctly position infant at breast and maintain latch. Intervention(s): Breastfeeding basics reviewed;Support Pillows;Position options;Skin to skin  LATCH Score: 7  Lactation Tools Discussed/Used Tools: Shells;Nipple Shields;Pump;44F feeding tube / Syringe Nipple shield size: 20 Shell Type: Sore Breast pump type: Double-Electric Breast Pump Pump Review: Setup,  frequency, and cleaning;Milk Storage Initiated by:: Peri Jefferson RN Date initiated:: 10/30/15   Consult Status Consult Status: Follow-up Date: 10/30/15 Follow-up type: In-patient    Charyl Dancer 10/30/2015, 6:56 AM

## 2015-10-30 NOTE — Discharge Summary (Signed)
[facility] Discharge Summary   Patient Name:   Katelyn Warner DOB:     1990-07-06 MRN:     [id number]  Date of Admission:   10/27/2015 Date of Discharge:  10/30/2015  Admitting diagnosis:    CTX Active Problems:   Vaginal delivery   Second-degree perineal laceration, with delivery  Term Pregnancy Delivered and Suspected Chorio    Discharge diagnosis:    CTX Active Problems:   Vaginal delivery   Second-degree perineal laceration, with delivery  Term Pregnancy Delivered                                                                     Post partum procedures: None  Type of Delivery:  SVB  Delivering Provider: Sherre Scarlet   Date of Delivery:  10/28/2015  Newborn Data:    Live born female  Birth Weight: 6 lb 11.4 oz (3045 g) APGARS: 8, 9  Baby's Name:  Layana Baby Feeding:   Breast Disposition:   home with mother  Complications:   Suspected Chorio - received antibiotics (Unasyn) during labor and in the immediate pp period. White count 23.5 on 10/28/15 - repeated prior to d/c and noted to be trending down appropriately at 14.3.   Hospital course:      Onset of Labor With Vaginal Delivery     25 y.o. yo G1P1001 at [redacted]w[redacted]d was admitted in Latent Labor on 10/27/2015. Patient had an uncomplicated labor course as follows:  Membrane Rupture Time/Date: 6:57 AM ,10/27/2015   Intrapartum Procedures: Episiotomy: None [1]                                         Lacerations:  2nd degree [3];Vaginal [6]  Patient had a delivery of a Viable infant. 10/28/2015  Information for the patient's newborn:  Jessaca, [place] Girl Asa [[id number]]  Delivery Method: Vaginal, Spontaneous Delivery (Filed from Delivery Summary)    Pateint had an uncomplicated postpartum course.  She is ambulating, tolerating a regular diet, passing flatus, and urinating well. Patient is discharged home in stable condition on 10/30/15.    Physical Exam:   Vitals:   10/28/15 1821 10/29/15 0600 10/29/15  1801 10/30/15 0532  BP: 114/65 113/67 127/66 119/66  Pulse: 77 74 87 76  Resp: Temp: 97.7 F (36.5 C) 98.7 F (37.1 C) 98.1 F (36.7 C) 98 F (36.7 C)  TempSrc: Oral Oral Oral Oral  SpO2: 100%  100%   Weight:      Height:       General: alert, cooperative and no distress Lochia: appropriate Uterine Fundus: firm Incision: Healing well with no significant drainage DVT Evaluation: No evidence of DVT seen on physical exam. Negative Homan's sign. No cords or calf tenderness. No significant calf/ankle edema.  Labs: Lab Results  Component Value Date   WBC 14.3 (H) 10/30/2015   HGB 10.6 (L) 10/30/2015   HCT 30.8 (L) 10/30/2015   MCV 96.9 10/30/2015   PLT 163 10/30/2015   Prenatal labs: ABO, Rh:  O+ Antibody:   Rubella:  !Error!Immune RPR:    HBsAg:   Neg HIV:   Neg  GBS:  Neg  Discharge instruction: per After Visit Summary and "Baby and Me Booklet".  After Visit Meds:    Medication List    TAKE these medications   acetaminophen 325 MG tablet Commonly known as:  TYLENOL Take 650 mg by mouth every 6 (six) hours as needed for moderate pain.   ibuprofen 600 MG tablet Commonly known as:  ADVIL,MOTRIN Take 1 tablet (600 mg total) by mouth every 6 (six) hours.   prenatal multivitamin Tabs tablet Take 1 tablet by mouth daily at 12 noon.   valACYclovir 500 MG tablet Commonly known as:  VALTREX Take 500 mg by mouth 2 (two) times daily.       Diet: routine diet  Activity: Advance as tolerated. Pelvic rest for 6 weeks.   Outpatient follow up:5 weeks Postpartum contraception: IUD Mirena  10/30/2015 Sherre Scarlet, CNM

## 2015-10-30 NOTE — Lactation Note (Signed)
This note was copied from a baby's chart. Lactation Consultation Note Follow up consult with this mom and term baby, now 87 hours old. Mom has baby latched well with 20 nipple shield. The baby has been supplemented with 1`0 ml's of formula, but is showing strong hunger cues. Since mom is not expressing any breast milk, I advised the parents to increase supplmetn amounts , as high as 30 ml's for today, and then let her take as much as she wants. Dad very helpful, filled feeding syringe with formula and handed to me. I showed parents how the feeding tube can by placed under the shield, and the tube can be very gently pushed, if needed, while the baby is sucking. She took a total of 18 ml's formula, and seemed satiated. I then had mom pump in maintenance setting, and she only expressed a drop of colostrum.  Mom is to continue with lathcing baby with supplement, once home, and to pump to protect her milk, every 3 hours. I gave mom more syringes and tubing, and made an o/p appointment for next Monday, 7/31. Mom knows to call for questions/concers prior to this as needed Patient Name: Katelyn Warner Date: 10/30/2015 Reason for consult: Follow-up assessment   Maternal Data    Feeding Feeding Type: Breast Fed Length of feed: 45 min (on and off)  LATCH Score/Interventions Latch: Grasps breast easily, tongue down, lips flanged, rhythmical sucking. (with 20 nipple shield) Intervention(s): Teach feeding cues Intervention(s): Adjust position;Assist with latch;Breast compression  Audible Swallowing: Spontaneous and intermittent (with syringe and tubing of formula in shield) Intervention(s): Hand expression  Type of Nipple: (S) Flat (semi flat, but evert with stim)  Comfort (Breast/Nipple): Filling, red/small blisters or bruises, mild/mod discomfort  Problem noted: Mild/Moderate discomfort;Filling  Hold (Positioning): Assistance needed to correctly position infant at breast and maintain  latch. Intervention(s): Breastfeeding basics reviewed;Support Pillows;Position options  LATCH Score: 7  Lactation Tools Discussed/Used Nipple shield size: 20   Consult Status Consult Status: Complete Date: 11/06/15 Follow-up type: Out-patient    Alfred Levins 10/30/2015, 10:56 AM

## 2015-10-30 NOTE — Lactation Note (Signed)
This note was copied from a baby's chart. Lactation Consultation Note New mom needing assistance w/BF. Baby lost 8% at 46 hrs. Gave mom shells, asked mom to call for next feeding to assess latch, feeding, transfer, and colostrum. Discussed positioning, depth, using NS, verses not, supplementing, and weight loss. Mom is to call for next feeding.  Patient Name: Katelyn Warner ENIDP'O Date: 10/30/2015 Reason for consult: Follow-up assessment;Infant weight loss   Maternal Data    Feeding Feeding Type: Breast Fed Length of feed: 15 min  LATCH Score/Interventions Intervention(s): Skin to skin;Teach feeding cues;Waking techniques     Type of Nipple: Flat Intervention(s): Hand pump;Shells  Comfort (Breast/Nipple): Soft / non-tender           Lactation Tools Discussed/Used Tools: Shells;Nipple Dorris Carnes;Pump Nipple shield size: 20 Shell Type: Inverted Breast pump type: Manual Pump Review: Setup, frequency, and cleaning;Milk Storage Initiated by:: RN Date initiated:: 10/29/15   Consult Status Consult Status: Follow-up Date: 10/30/15 Follow-up type: In-patient    Mitsuko Luera, Diamond Nickel 10/30/2015, 2:17 AM

## 2015-10-30 NOTE — Discharge Instructions (Signed)
Intrauterine Device Information An intrauterine device (IUD) is inserted into your uterus to prevent pregnancy. There are two types of IUDs available:   Copper IUD--This type of IUD is wrapped in copper wire and is placed inside the uterus. Copper makes the uterus and fallopian tubes produce a fluid that kills sperm. The copper IUD can stay in place for 10 years.  Hormone IUD--This type of IUD contains the hormone progestin (synthetic progesterone). The hormone thickens the cervical mucus and prevents sperm from entering the uterus. It also thins the uterine lining to prevent implantation of a fertilized egg. The hormone can weaken or kill the sperm that get into the uterus. One type of hormone IUD can stay in place for 5 years, and another type can stay in place for 3 years. Your health care provider will make sure you are a good candidate for a contraceptive IUD. Discuss with your health care provider the possible side effects.  ADVANTAGES OF AN INTRAUTERINE DEVICE  IUDs are highly effective, reversible, Mcnall acting, and low maintenance.   There are no estrogen-related side effects.   An IUD can be used when breastfeeding.   IUDs are not associated with weight gain.   The copper IUD works immediately after insertion.   The hormone IUD works right away if inserted within 7 days of your period starting. You will need to use a backup method of birth control for 7 days if the hormone IUD is inserted at any other time in your cycle.  The copper IUD does not interfere with your female hormones.   The hormone IUD can make heavy menstrual periods lighter and decrease cramping.   The hormone IUD can be used for 3 or 5 years.   The copper IUD can be used for 10 years. DISADVANTAGES OF AN INTRAUTERINE DEVICE  The hormone IUD can be associated with irregular bleeding patterns.   The copper IUD can make your menstrual flow heavier and more painful.   You may experience cramping and  vaginal bleeding after insertion.    This information is not intended to replace advice given to you by your health care provider. Make sure you discuss any questions you have with your health care provider.   Document Released: 02/27/2004 Document Revised: 11/25/2012 Document Reviewed: 09/13/2012 Elsevier Interactive Patient Education 2016 [employer] Breastfeeding and Mastitis Mastitis is inflammation of the breast tissue. It can occur in women who are breastfeeding. This can make breastfeeding painful. Mastitis will sometimes go away on its own. Your health care provider will help determine if treatment is needed. CAUSES Mastitis is often associated with a blocked milk (lactiferous) duct. This can happen when too much milk builds up in the breast. Causes of excess milk in the breast can include:  Poor latch-on. If your baby is not latched onto the breast properly, she or he may not empty your breast completely while breastfeeding.  Allowing too much time to pass between feedings.  Wearing a bra or other clothing that is too tight. This puts extra pressure on the lactiferous ducts so milk does not flow through them as it should. Mastitis can also be caused by a bacterial infection. Bacteria may enter the breast tissue through cuts or openings in the skin. In women who are breastfeeding, this may occur because of cracked or irritated skin. Cracks in the skin are often caused when your baby does not latch on properly to the breast. SIGNS AND SYMPTOMS  Swelling, redness, tenderness, and pain in an  area of the breast.  Swelling of the glands under the arm on the same side.  Fever may or may not accompany mastitis. If an infection is allowed to progress, a collection of pus (abscess) may develop. DIAGNOSIS  Your health care provider can usually diagnose mastitis based on your symptoms and a physical exam. Tests may be done to help confirm the diagnosis. These may include:  Removal of pus  from the breast by applying pressure to the area. This pus can be examined in the lab to determine which bacteria are present. If an abscess has developed, the fluid in the abscess can be removed with a needle. This can also be used to confirm the diagnosis and determine the bacteria present. In most cases, pus will not be present.  Blood tests to determine if your body is fighting a bacterial infection.  Mammogram or ultrasound tests to rule out other problems or diseases. TREATMENT  Mastitis that occurs with breastfeeding will sometimes go away on its own. Your health care provider may choose to wait 24 hours after first seeing you to decide whether a prescription medicine is needed. If your symptoms are worse after 24 hours, your health care provider will likely prescribe an antibiotic medicine to treat the mastitis. He or she will determine which bacteria are most likely causing the infection and will then select an appropriate antibiotic medicine. This is sometimes changed based on the results of tests performed to identify the bacteria, or if there is no response to the antibiotic medicine selected. Antibiotic medicines are usually given by mouth. You may also be given medicine for pain. HOME CARE INSTRUCTIONS  Only take over-the-counter or prescription medicines for pain, fever, or discomfort as directed by your health care provider.  If your health care provider prescribed an antibiotic medicine, take the medicine as directed. Make sure you finish it even if you start to feel better.  Do not wear a tight or underwire bra. Wear a soft, supportive bra.  Increase your fluid intake, especially if you have a fever.  Continue to empty the breast. Your health care provider can tell you whether this milk is safe for your infant or needs to be thrown out. You may be told to stop nursing until your health care provider thinks it is safe for your baby. Use a breast pump if you are advised to stop  nursing.  Keep your nipples clean and dry.  Empty the first breast completely before going to the other breast. If your baby is not emptying your breasts completely for some reason, use a breast pump to empty your breasts.  If you go back to work, pump your breasts while at work to stay in time with your nursing schedule.  Avoid allowing your breasts to become overly filled with milk (engorged). SEEK MEDICAL CARE IF:  You have pus-like discharge from the breast.  Your symptoms do not improve with the treatment prescribed by your health care provider within 2 days. SEEK IMMEDIATE MEDICAL CARE IF:  Your pain and swelling are getting worse.  You have pain that is not controlled with medicine.  You have a red line extending from the breast toward your armpit.  You have a fever or persistent symptoms for more than 2-3 days.  You have a fever and your symptoms suddenly get worse. MAKE SURE YOU:   Understand these instructions.  Will watch your condition.  Will get help right away if you are not doing well or get  worse.   This information is not intended to replace advice given to you by your health care provider. Make sure you discuss any questions you have with your health care provider.   Document Released: 07/20/2004 Document Revised: 03/30/2013 Document Reviewed: 10/29/2012 Elsevier Interactive Patient Education [employer] Breastfeeding Deciding to breastfeed is one of the best choices you can make for you and your baby. A change in hormones during pregnancy causes your breast tissue to grow and increases the number and size of your milk ducts. These hormones also allow proteins, sugars, and fats from your blood supply to make breast milk in your milk-producing glands. Hormones prevent breast milk from being released before your baby is born as well as prompt milk flow after birth. Once breastfeeding has begun, thoughts of your baby, as well as his or her sucking or  crying, can stimulate the release of milk from your milk-producing glands.  BENEFITS OF BREASTFEEDING For Your Baby  Your first milk (colostrum) helps your baby's digestive system function better.  There are antibodies in your milk that help your baby fight off infections.  Your baby has a lower incidence of asthma, allergies, and sudden infant death syndrome.  The nutrients in breast milk are better for your baby than infant formulas and are designed uniquely for your baby's needs.  Breast milk improves your baby's brain development.  Your baby is less likely to develop other conditions, such as childhood obesity, asthma, or type 2 diabetes mellitus. For You  Breastfeeding helps to create a very special bond between you and your baby.  Breastfeeding is convenient. Breast milk is always available at the correct temperature and costs nothing.  Breastfeeding helps to burn calories and helps you lose the weight gained during pregnancy.  Breastfeeding makes your uterus contract to its prepregnancy size faster and slows bleeding (lochia) after you give birth.   Breastfeeding helps to lower your risk of developing type 2 diabetes mellitus, osteoporosis, and breast or ovarian cancer later in life. SIGNS THAT YOUR BABY IS HUNGRY Early Signs of Hunger  Increased alertness or activity.  Stretching.  Movement of the head from side to side.  Movement of the head and opening of the mouth when the corner of the mouth or cheek is stroked (rooting).  Increased sucking sounds, smacking lips, cooing, sighing, or squeaking.  Hand-to-mouth movements.  Increased sucking of fingers or hands. Late Signs of Hunger  Fussing.  Intermittent crying. Extreme Signs of Hunger Signs of extreme hunger will require calming and consoling before your baby will be able to breastfeed successfully. Do not wait for the following signs of extreme hunger to occur before you initiate  breastfeeding:  Restlessness.  A loud, strong cry.  Screaming. BREASTFEEDING BASICS Breastfeeding Initiation  Find a comfortable place to sit or lie down, with your neck and back well supported.  Place a pillow or rolled up blanket under your baby to bring him or her to the level of your breast (if you are seated). Nursing pillows are specially designed to help support your arms and your baby while you breastfeed.  Make sure that your baby's abdomen is facing your abdomen.  Gently massage your breast. With your fingertips, massage from your chest wall toward your nipple in a circular motion. This encourages milk flow. You may need to continue this action during the feeding if your milk flows slowly.  Support your breast with 4 fingers underneath and your thumb above your nipple. Make sure your fingers  are well away from your nipple and your baby's mouth.  Stroke your baby's lips gently with your finger or nipple.  When your baby's mouth is open wide enough, quickly bring your baby to your breast, placing your entire nipple and as much of the colored area around your nipple (areola) as possible into your baby's mouth.  More areola should be visible above your baby's upper lip than below the lower lip.  Your baby's tongue should be between his or her lower gum and your breast.  Ensure that your baby's mouth is correctly positioned around your nipple (latched). Your baby's lips should create a seal on your breast and be turned out (everted).  It is common for your baby to suck about 2-3 minutes in order to start the flow of breast milk. Latching Teaching your baby how to latch on to your breast properly is very important. An improper latch can cause nipple pain and decreased milk supply for you and poor weight gain in your baby. Also, if your baby is not latched onto your nipple properly, he or she may swallow some air during feeding. This can make your baby fussy. Burping your baby when  you switch breasts during the feeding can help to get rid of the air. However, teaching your baby to latch on properly is still the best way to prevent fussiness from swallowing air while breastfeeding. Signs that your baby has successfully latched on to your nipple:  Silent tugging or silent sucking, without causing you pain.  Swallowing heard between every 3-4 sucks.  Muscle movement above and in front of his or her ears while sucking. Signs that your baby has not successfully latched on to nipple:  Sucking sounds or smacking sounds from your baby while breastfeeding.  Nipple pain. If you think your baby has not latched on correctly, slip your finger into the corner of your baby's mouth to break the suction and place it between your baby's gums. Attempt breastfeeding initiation again. Signs of Successful Breastfeeding Signs from your baby:  A gradual decrease in the number of sucks or complete cessation of sucking.  Falling asleep.  Relaxation of his or her body.  Retention of a small amount of milk in his or her mouth.  Letting go of your breast by himself or herself. Signs from you:  Breasts that have increased in firmness, weight, and size 1-3 hours after feeding.  Breasts that are softer immediately after breastfeeding.  Increased milk volume, as well as a change in milk consistency and color by the fifth day of breastfeeding.  Nipples that are not sore, cracked, or bleeding. Signs That Your Pecola Leisure is Getting Enough Milk  Wetting at least 3 diapers in a 24-hour period. The urine should be clear and pale yellow by age 31 days.  At least 3 stools in a 24-hour period by age 31 days. The stool should be soft and yellow.  At least 3 stools in a 24-hour period by age 74 days. The stool should be seedy and yellow.  No loss of weight greater than 10% of birth weight during the first 49 days of age.  Average weight gain of 4-7 ounces (113-198 g) per week after age 79  days.  Consistent daily weight gain by age 31 days, without weight loss after the age of 2 weeks. After a feeding, your baby may spit up a small amount. This is common. BREASTFEEDING FREQUENCY AND DURATION Frequent feeding will help you make more milk and can  prevent sore nipples and breast engorgement. Breastfeed when you feel the need to reduce the fullness of your breasts or when your baby shows signs of hunger. This is called "breastfeeding on demand." Avoid introducing a pacifier to your baby while you are working to establish breastfeeding (the first 4-6 weeks after your baby is born). After this time you may choose to use a pacifier. Research has shown that pacifier use during the first year of a baby's life decreases the risk of sudden infant death syndrome (SIDS). Allow your baby to feed on each breast as [place] as he or she wants. Breastfeed until your baby is finished feeding. When your baby unlatches or falls asleep while feeding from the first breast, offer the second breast. Because newborns are often sleepy in the first few weeks of life, you may need to awaken your baby to get him or her to feed. Breastfeeding times will vary from baby to baby. However, the following rules can serve as a guide to help you ensure that your baby is properly fed:  Newborns (babies 15 weeks of age or younger) may breastfeed every 1-3 hours.  Newborns should not go longer than 3 hours during the day or 5 hours during the night without breastfeeding.  You should breastfeed your baby a minimum of 8 times in a 24-hour period until you begin to introduce solid foods to your baby at around 26 months of age. BREAST MILK PUMPING Pumping and storing breast milk allows you to ensure that your baby is exclusively fed your breast milk, even at times when you are unable to breastfeed. This is especially important if you are going back to work while you are still breastfeeding or when you are not able to be present during  feedings. Your lactation consultant can give you guidelines on how Kia it is safe to store breast milk. A breast pump is a machine that allows you to pump milk from your breast into a sterile bottle. The pumped breast milk can then be stored in a refrigerator or freezer. Some breast pumps are operated by hand, while others use electricity. Ask your lactation consultant which type will work best for you. Breast pumps can be purchased, but some hospitals and breastfeeding support groups lease breast pumps on a monthly basis. A lactation consultant can teach you how to hand express breast milk, if you prefer not to use a pump. CARING FOR YOUR BREASTS WHILE YOU BREASTFEED Nipples can become dry, cracked, and sore while breastfeeding. The following recommendations can help keep your breasts moisturized and healthy:  Avoid using soap on your nipples.  Wear a supportive bra. Although not required, special nursing bras and tank tops are designed to allow access to your breasts for breastfeeding without taking off your entire bra or top. Avoid wearing underwire-style bras or extremely tight bras.  Air dry your nipples for 3-66minutes after each feeding.  Use only cotton bra pads to absorb leaked breast milk. Leaking of breast milk between feedings is normal.  Use lanolin on your nipples after breastfeeding. Lanolin helps to maintain your skin's normal moisture barrier. If you use pure lanolin, you do not need to wash it off before feeding your baby again. Pure lanolin is not toxic to your baby. You may also hand express a few drops of breast milk and gently massage that milk into your nipples and allow the milk to air dry. In the first few weeks after giving birth, some women experience extremely full breasts (  engorgement). Engorgement can make your breasts feel heavy, warm, and tender to the touch. Engorgement peaks within 3-5 days after you give birth. The following recommendations can help ease  engorgement:  Completely empty your breasts while breastfeeding or pumping. You may want to start by applying warm, moist heat (in the shower or with warm water-soaked hand towels) just before feeding or pumping. This increases circulation and helps the milk flow. If your baby does not completely empty your breasts while breastfeeding, pump any extra milk after he or she is finished.  Wear a snug bra (nursing or regular) or tank top for 1-2 days to signal your body to slightly decrease milk production.  Apply ice packs to your breasts, unless this is too uncomfortable for you.  Make sure that your baby is latched on and positioned properly while breastfeeding. If engorgement persists after 48 hours of following these recommendations, contact your health care provider or a Advertising copywriter. OVERALL HEALTH CARE RECOMMENDATIONS WHILE BREASTFEEDING  Eat healthy foods. Alternate between meals and snacks, eating 3 of each per day. Because what you eat affects your breast milk, some of the foods may make your baby more irritable than usual. Avoid eating these foods if you are sure that they are negatively affecting your baby.  Drink milk, fruit juice, and water to satisfy your thirst (about 10 glasses a day).  Rest often, relax, and continue to take your prenatal vitamins to prevent fatigue, stress, and anemia.  Continue breast self-awareness checks.  Avoid chewing and smoking tobacco. Chemicals from cigarettes that pass into breast milk and exposure to secondhand smoke may harm your baby.  Avoid alcohol and drug use, including marijuana. Some medicines that may be harmful to your baby can pass through breast milk. It is important to ask your health care provider before taking any medicine, including all over-the-counter and prescription medicine as well as vitamin and herbal supplements. It is possible to become pregnant while breastfeeding. If birth control is desired, ask your health care  provider about options that will be safe for your baby. SEEK MEDICAL CARE IF:  You feel like you want to stop breastfeeding or have become frustrated with breastfeeding.  You have painful breasts or nipples.  Your nipples are cracked or bleeding.  Your breasts are red, tender, or warm.  You have a swollen area on either breast.  You have a fever or chills.  You have nausea or vomiting.  You have drainage other than breast milk from your nipples.  Your breasts do not become full before feedings by the fifth day after you give birth.  You feel sad and depressed.  Your baby is too sleepy to eat well.  Your baby is having trouble sleeping.   Your baby is wetting less than 3 diapers in a 24-hour period.  Your baby has less than 3 stools in a 24-hour period.  Your baby's skin or the white part of his or her eyes becomes yellow.   Your baby is not gaining weight by 44 days of age. SEEK IMMEDIATE MEDICAL CARE IF:  Your baby is overly tired (lethargic) and does not want to wake up and feed.  Your baby develops an unexplained fever.   This information is not intended to replace advice given to you by your health care provider. Make sure you discuss any questions you have with your health care provider.   Document Released: 03/25/2005 Document Revised: 12/14/2014 Document Reviewed: 09/16/2012 Elsevier Interactive Patient Education [employer]  Postpartum Depression and Baby Blues The postpartum period begins right after the birth of a baby. During this time, there is often a great amount of joy and excitement. It is also a time of many changes in the life of the parents. Regardless of how many times a mother gives birth, each child brings new challenges and dynamics to the family. It is not unusual to have feelings of excitement along with confusing shifts in moods, emotions, and thoughts. All mothers are at risk of developing postpartum depression or the "baby blues." These  mood changes can occur right after giving birth, or they may occur many months after giving birth. The baby blues or postpartum depression can be mild or severe. Additionally, postpartum depression can go away rather quickly, or it can be a Eddie-term condition.  CAUSES Raised hormone levels and the rapid drop in those levels are thought to be a main cause of postpartum depression and the baby blues. A number of hormones change during and after pregnancy. Estrogen and progesterone usually decrease right after the delivery of your baby. The levels of thyroid hormone and various cortisol steroids also rapidly drop. Other factors that play a role in these mood changes include major life events and genetics.  RISK FACTORS If you have any of the following risks for the baby blues or postpartum depression, know what symptoms to watch out for during the postpartum period. Risk factors that may increase the likelihood of getting the baby blues or postpartum depression include:  Having a personal or family history of depression.   Having depression while being pregnant.   Having premenstrual mood issues or mood issues related to oral contraceptives.  Having a lot of life stress.   Having marital conflict.   Lacking a social support network.   Having a baby with special needs.   Having health problems, such as diabetes.  SIGNS AND SYMPTOMS Symptoms of baby blues include:  Brief changes in mood, such as going from extreme happiness to sadness.  Decreased concentration.   Difficulty sleeping.   Crying spells, tearfulness.   Irritability.   Anxiety.  Symptoms of postpartum depression typically begin within the first month after giving birth. These symptoms include:  Difficulty sleeping or excessive sleepiness.   Marked weight loss.   Agitation.   Feelings of worthlessness.   Lack of interest in activity or food.  Postpartum psychosis is a very serious condition and  can be dangerous. Fortunately, it is rare. Displaying any of the following symptoms is cause for immediate medical attention. Symptoms of postpartum psychosis include:   Hallucinations and delusions.   Bizarre or disorganized behavior.   Confusion or disorientation.  DIAGNOSIS  A diagnosis is made by an evaluation of your symptoms. There are no medical or lab tests that lead to a diagnosis, but there are various questionnaires that a health care provider may use to identify those with the baby blues, postpartum depression, or psychosis. Often, a screening tool called the [place] Postnatal Depression Scale is used to diagnose depression in the postpartum period.  TREATMENT The baby blues usually goes away on its own in 1-2 weeks. Social support is often all that is needed. You will be encouraged to get adequate sleep and rest. Occasionally, you may be given medicines to help you sleep.  Postpartum depression requires treatment because it can last several months or longer if it is not treated. Treatment may include individual or group therapy, medicine, or both to address any social, physiological, and  psychological factors that may play a role in the depression. Regular exercise, a healthy diet, rest, and social support may also be strongly recommended.  Postpartum psychosis is more serious and needs treatment right away. Hospitalization is often needed. HOME CARE INSTRUCTIONS  Get as much rest as you can. Nap when the baby sleeps.   Exercise regularly. Some women find yoga and walking to be beneficial.   Eat a balanced and nourishing diet.   Do little things that you enjoy. Have a cup of tea, take a bubble bath, read your favorite magazine, or listen to your favorite music.  Avoid alcohol.   Ask for help with household chores, cooking, grocery shopping, or running errands as needed. Do not try to do everything.   Talk to people close to you about how you are feeling. Get support  from your partner, family members, friends, or other new moms.  Try to stay positive in how you think. Think about the things you are grateful for.   Do not spend a lot of time alone.   Only take over-the-counter or prescription medicine as directed by your health care provider.  Keep all your postpartum appointments.   Let your health care provider know if you have any concerns.  SEEK MEDICAL CARE IF: You are having a reaction to or problems with your medicine. SEEK IMMEDIATE MEDICAL CARE IF:  You have suicidal feelings.   You think you may harm the baby or someone else. MAKE SURE YOU:  Understand these instructions.  Will watch your condition.  Will get help right away if you are not doing well or get worse.   This information is not intended to replace advice given to you by your health care provider. Make sure you discuss any questions you have with your health care provider.   Document Released: 12/28/2003 Document Revised: 03/30/2013 Document Reviewed: 01/04/2013 Elsevier Interactive Patient Education 2016 Else[employer] Postpartum Care After Vaginal Delivery After you deliver your newborn (postpartum period), the usual stay in the hospital is 24-72 hours. If there were problems with your labor or delivery, or if you have other medical problems, you might be in the hospital longer.  While you are in the hospital, you will receive help and instructions on how to care for yourself and your newborn during the postpartum period.  While you are in the hospital:  Be sure to tell your nurses if you have pain or discomfort, as well as where you feel the pain and what makes the pain worse.  If you had an incision made near your vagina (episiotomy) or if you had some tearing during delivery, the nurses may put ice packs on your episiotomy or tear. The ice packs may help to reduce the pain and swelling.  If you are breastfeeding, you may feel uncomfortable contractions of your  uterus for a couple of weeks. This is normal. The contractions help your uterus get back to normal size.  It is normal to have some bleeding after delivery.  For the first 1-3 days after delivery, the flow is red and the amount may be similar to a period.  It is common for the flow to start and stop.  In the first few days, you may pass some small clots. Let your nurses know if you begin to pass large clots or your flow increases.  Do not  flush blood clots down the toilet before having the nurse look at them.  During the next 3-10 days after delivery,  your flow should become more watery and pink or brown-tinged in color.  Ten to fourteen days after delivery, your flow should be a small amount of yellowish-white discharge.  The amount of your flow will decrease over the first few weeks after delivery. Your flow may stop in 6-8 weeks. Most women have had their flow stop by 12 weeks after delivery.  You should change your sanitary pads frequently.  Wash your hands thoroughly with soap and water for at least 20 seconds after changing pads, using the toilet, or before holding or feeding your newborn.  You should feel like you need to empty your bladder within the first 6-8 hours after delivery.  In case you become weak, lightheaded, or faint, call your nurse before you get out of bed for the first time and before you take a shower for the first time.  Within the first few days after delivery, your breasts may begin to feel tender and full. This is called engorgement. Breast tenderness usually goes away within 48-72 hours after engorgement occurs. You may also notice milk leaking from your breasts. If you are not breastfeeding, do not stimulate your breasts. Breast stimulation can make your breasts produce more milk.  Spending as much time as possible with your newborn is very important. During this time, you and your newborn can feel close and get to know each other. Having your newborn stay  in your room (rooming in) will help to strengthen the bond with your newborn. It will give you time to get to know your newborn and become comfortable caring for your newborn.  Your hormones change after delivery. Sometimes the hormone changes can temporarily cause you to feel sad or tearful. These feelings should not last more than a few days. If these feelings last longer than that, you should talk to your caregiver.  If desired, talk to your caregiver about methods of family planning or contraception.  Talk to your caregiver about immunizations. Your caregiver may want you to have the following immunizations before leaving the hospital:  Tetanus, diphtheria, and pertussis (Tdap) or tetanus and diphtheria (Td) immunization. It is very important that you and your family (including grandparents) or others caring for your newborn are up-to-date with the Tdap or Td immunizations. The Tdap or Td immunization can help protect your newborn from getting ill.  Rubella immunization.  Varicella (chickenpox) immunization.  Influenza immunization. You should receive this annual immunization if you did not receive the immunization during your pregnancy.   This information is not intended to replace advice given to you by your health care provider. Make sure you discuss any questions you have with your health care provider.   Document Released: 01/20/2007 Document Revised: 12/18/2011 Document Reviewed: 11/20/2011 Elsevier Interactive Patient Education [employer]

## 2015-11-06 ENCOUNTER — Ambulatory Visit (HOSPITAL_COMMUNITY)
Admit: 2015-11-06 | Discharge: 2015-11-06 | Disposition: A | Discharge status: Home / Self Care | Payer: Medicaid Other | Attending: Obstetrics and Gynecology | Admitting: Obstetrics and Gynecology

## 2015-11-06 NOTE — Lactation Note (Signed)
Lactation Consult  Mother's reason for visit: assist with latching onto the breast without the NS  Visit Type: feeding assessment  Appointment Notes:  Tight tongue , cupping, NS, Supplement with formula, via syringe and SNS .  Confirmed for 7/31  Consult:  Initial Lactation Consultant:  Kathrin Greathouse  ________________________________________________________________________  Baby's Name:  Katelyn Warner Date of Birth:  10/28/2016 [facility]  Gender:  female Gestational Age: [redacted]w[redacted]d (At Birth) Birth Weight:  6 lb 11.4 oz (3045 g) Weight at Discharge:  Weight: 6 lb 2.4 oz (2790 g)                                   Date of Discharge:  10/30/2015      Filed Weights   10/28/15 0103 10/28/15 2300 10/30/15 0011  Weight: 6 lb 11.4 oz (3045 g) 6 lb 5.6 oz (2880 g) 6 lb 2.4 oz (2790 g)  Last weight taken from location outside of [facility]: 7/25 - 6-4 oz      Location:Pediatrician's office Weight today:  6-9.9 oz 3002 g    ________________________________________________________________________  Mother's Name: Katelyn Warner Type of delivery:  Vaginal  Breastfeeding Experience: 1st baby  Maternal Medical Conditions:  No risk  Maternal Medications:  PNV   ________________________________________________________________________  Breastfeeding History (Post Discharge)  Frequency of breastfeeding: 8 plus rimes a day  Duration of feeding:  30 -40 mins ( using the #20 NS )   Supplementing: per mom up until yesterday after breast feeding  Pumping: DEBP - Medela , - 4 x's in 24 hours - 15 -30 ml    Infant Intake and Output Assessment  Voids: 6  in 24 hrs.  Color:  Clear yellow Stools:  4  in 24 hrs.  Color:  Yellow  ________________________________________________________________________  Maternal Breast Assessment  Breast:  Full Nipple:  Erect Pain level:  0 Pain interventions:  Expressed breast  milk  _______________________________________________________________________ Feeding Assessment/Evaluation  Initial feeding assessment:  Infant's oral assessment:  Variance -  LC examined baby's mouth with a glove - noted labial frenulum to be short - upper lip stretches well  With exam and at the breast , short anterior lingual frenulum noted, and cupping of the tongue.  Probably why the baby can't sustain a latch without the NS. Also high palate.   Positioning:  Football Left breast  LATCH documentation:  Latch:  2 = Grasps breast easily, tongue down, lips flanged, rhythmical sucking.  Audible swallowing:  2 = Spontaneous and intermittent  Type of nipple:  2 = Everted at rest and after stimulation  Comfort (Breast/Nipple):  1 = Filling, red/small blisters or bruises, mild/mod discomfort  Hold (Positioning):  1 = Assistance needed to correctly position infant at breast and maintain latch  LATCH score:  8   Attached assessment:  Deep  Lips flanged:  No.  Lips untucked:  Yes.    Suck assessment:  Nutritive  Tools:  Nipple shield 24 mm Instructed on use and cleaning of tool:  Yes.    Pre-feed weight: 3002 g , 6-9.9 oz  Post-feed weight:  3022 g , 6-10.6 oz  Amount transferred: 20 ml  Amount supplemented:  None   Additional Feeding Assessment -   Infant's oral assessment:  Variance - see above note   Positioning:  Football Left breast  LATCH documentation:  Latch:  2 = Grasps breast easily, tongue  down, lips flanged, rhythmical sucking.  Audible swallowing:  2 = Spontaneous and intermittent  Type of nipple:  2 = Everted at rest and after stimulation  Comfort (Breast/Nipple):  1 = Filling, red/small blisters or bruises, mild/mod discomfort  Hold (Positioning):  1 = Assistance needed to correctly position infant at breast and maintain latch  LATCH score:  8   Attached assessment:  Shallow @ 1st , flipped upper lip open   Lips flanged:  No.  Lips untucked:  Yes.     Suck assessment:  Nutritive  Tools:  Nipple shield 24 mm Instructed on use and cleaning of tool:  Yes.     Wet / stool changed   Pre-feed weight:  3008 g , 6-10.1 oz  Post-feed weight:  3018 g , 6-10.5 oz  Amount transferred: 10 ml  Amount supplemented: none   Yellow stool changed and re - weight   Pre- feed weight: 3016 g , 6-10.4 oz  Post - feed weight : 3-20 g , 6-10.5 oz  Amount transferred: 4 ml  Total amount pumped post feed: ( did not have time , mom had a WIC appt. At 3 pm )   Total amount transferred:  34 ml  Total supplement given: none    Lactation impression: Oral variance - see note above for LC's assessment  mom is very motivated to breast feed @ consult attempted on both breast to latch without the NS and baby unable  To sustain a latch , was latching on and off. Sustained a latch for 10 -15 mins  With  The NS. Latch score both breast 9 with #24 NS  Baby also ate at 12N 15 mins , and appt. Was at 1:30 pm . Baby didn't seem overly hungry, but did Breast feed from both sides. Mom is also pumping extra due to the use of NS for latching and initially at D/C weight loss was at 8 %     Lactation Plan of Care: Praised mom for her efforts breastfeeding and pumping Steps for Latching - 1st breast - breast massage, hand express if to full 10 -15 ml , pre-pump if needed  Skin to skin feedings until baby is gaining well  When latching tickle upper lip until the abby opens wide and breast compressions until latch.  Check lip line for "Fish lips" Growth Spurts - 7- 10 days , 3 weeks , 6 weeks.  Cluster feedings - normal  Use the #24 NS until the baby is gaining steadily and then call and make LC O/P appt. For 2 weeks when you know your schedule. Extra pumping - when to full to start - just to release off enough so baby can latch well  Also when breast are the fullest  If Katelyn Warner only feeds 1st breast and softens well , make sure she is settled and pump 2nd breast  down  7 -10 mins.  IF using the NS for latching - need to post pump after at least 4 feedings a day in 24 hours and when needed . ( as explained above )

## 2015-11-07 ENCOUNTER — Inpatient Hospital Stay (HOSPITAL_COMMUNITY): Admission: RE | Admit: 2015-11-07 | Payer: PRIVATE HEALTH INSURANCE | Source: Ambulatory Visit
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
## 2016-09-12 ENCOUNTER — Ambulatory Visit: Payer: Self-pay | Admitting: Physician Assistant

## 2016-12-24 ENCOUNTER — Other Ambulatory Visit: Payer: Self-pay | Admitting: Obstetrics and Gynecology

## 2017-01-10 ENCOUNTER — Other Ambulatory Visit: Payer: Self-pay | Admitting: Obstetrics and Gynecology

## 2017-01-13 ENCOUNTER — Encounter (HOSPITAL_COMMUNITY): Payer: Self-pay | Admitting: *Deleted

## 2017-01-13 NOTE — H&P (Signed)
Admission History and Physical Exam for a Gynecology Patient  Katelyn Warner is a 26 y.o. female, G1P1001, who presents for a cold knife conization of the cervix. She has been followed at the [facility]. 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  OB History    Gravida Para Term Preterm AB Living   SAB TAB Ectopic Multiple Live Births         0 1      No past medical history on file.  No prescriptions prior to admission.    No past surgical history on file.  No Known Allergies  Family History: family history is not on file.  Social History:  reports that she has never smoked. She does not have any smokeless tobacco history on file. She reports that she drinks alcohol. She reports that she does not use drugs.  Review of systems: See HPI.  Admission Physical Exam:    There is no height or weight on file to calculate BMI.  unknown if currently breastfeeding.  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 She understands the indications for her surgical procedure. She accepts the risk of, but not limited to, anesthetic complications, bleeding, infections, and possible damage to the surrounding organs. She understands that there is a risk that her cervix  will not be a strong after her procedure. She may be at increased risk for preterm labor or preterm cervical changes during pregnancy.   Janine Limbo 01/13/2017

## 2017-01-17 ENCOUNTER — Ambulatory Visit (HOSPITAL_COMMUNITY)
Admission: RE | Admit: 2017-01-17 | Discharge: 2017-01-17 | Disposition: A | Discharge status: Home / Self Care | Payer: BLUE CROSS/BLUE SHIELD | Source: Ambulatory Visit | Attending: Obstetrics and Gynecology | Admitting: Obstetrics and Gynecology

## 2017-01-17 ENCOUNTER — Encounter (HOSPITAL_COMMUNITY)
Admission: RE | Disposition: A | Discharge status: Home / Self Care | Payer: Self-pay | Source: Ambulatory Visit | Attending: Obstetrics and Gynecology

## 2017-01-17 ENCOUNTER — Ambulatory Visit (HOSPITAL_COMMUNITY): Payer: BLUE CROSS/BLUE SHIELD | Admitting: Anesthesiology

## 2017-01-17 ENCOUNTER — Encounter (HOSPITAL_COMMUNITY): Payer: Self-pay

## 2017-01-17 DIAGNOSIS — N871 Moderate cervical dysplasia: Secondary | ICD-10-CM | POA: Insufficient documentation

## 2017-01-17 HISTORY — PX: CERVICAL CONIZATION W/BX: SHX1330

## 2017-01-17 LAB — CBC
HCT: 41.9 % (ref 36.0–46.0)
Hemoglobin: 14.4 g/dL (ref 12.0–15.0)
MCH: 31.6 pg (ref 26.0–34.0)
MCHC: 34.4 g/dL (ref 30.0–36.0)
MCV: 92.1 fL (ref 78.0–100.0)
PLATELETS: 255 10*3/uL (ref 150–400)
RBC: 4.55 MIL/uL (ref 3.87–5.11)
RDW: 11.9 % (ref 11.5–15.5)
WBC: 6.8 10*3/uL (ref 4.0–10.5)

## 2017-01-17 LAB — PREGNANCY, URINE: PREG TEST UR: NEGATIVE

## 2017-01-17 SURGERY — CONE BIOPSY, CERVIX
Anesthesia: Monitor Anesthesia Care | Site: Vagina

## 2017-01-17 MED ORDER — MIDAZOLAM HCL 2 MG/2ML IJ SOLN
INTRAMUSCULAR | Status: AC
  Filled 2017-01-17: qty 2

## 2017-01-17 MED ORDER — LIDOCAINE HCL (CARDIAC) 20 MG/ML IV SOLN
INTRAVENOUS | Status: AC
  Filled 2017-01-17: qty 5

## 2017-01-17 MED ORDER — SODIUM CHLORIDE 0.9 % IJ SOLN
INTRAMUSCULAR | Status: AC
  Filled 2017-01-17: qty 50

## 2017-01-17 MED ORDER — ACETIC ACID 5 % SOLN
Status: AC
  Filled 2017-01-17: qty 500

## 2017-01-17 MED ORDER — SCOPOLAMINE 1 MG/3DAYS TD PT72
MEDICATED_PATCH | TRANSDERMAL | Status: AC
  Administered 2017-01-17: 1.5 mg via TRANSDERMAL
  Filled 2017-01-17: qty 1

## 2017-01-17 MED ORDER — SCOPOLAMINE 1 MG/3DAYS TD PT72
1.0000 | MEDICATED_PATCH | Freq: Once | TRANSDERMAL | Status: DC
  Administered 2017-01-17: 1.5 mg via TRANSDERMAL

## 2017-01-17 MED ORDER — PROPOFOL 10 MG/ML IV BOLUS
INTRAVENOUS | Status: AC
  Filled 2017-01-17: qty 20

## 2017-01-17 MED ORDER — LACTATED RINGERS IV SOLN
INTRAVENOUS | Status: DC
  Administered 2017-01-17: 08:00:00 via INTRAVENOUS

## 2017-01-17 MED ORDER — OXYCODONE-ACETAMINOPHEN 5-325 MG PO TABS: 1.0000 | ORAL_TABLET | ORAL | 0 refills | Status: AC | PRN

## 2017-01-17 MED ORDER — PROPOFOL 500 MG/50ML IV EMUL
INTRAVENOUS | Status: DC | PRN
  Administered 2017-01-17: 30 mg via INTRAVENOUS
  Administered 2017-01-17 (×3): 10 mg via INTRAVENOUS
  Administered 2017-01-17: 20 mg via INTRAVENOUS
  Administered 2017-01-17: 10 mg via INTRAVENOUS
  Administered 2017-01-17: 20 mg via INTRAVENOUS
  Administered 2017-01-17: 10 mg via INTRAVENOUS
  Administered 2017-01-17: 20 mg via INTRAVENOUS
  Administered 2017-01-17: 10 mg via INTRAVENOUS
  Administered 2017-01-17 (×6): 20 mg via INTRAVENOUS
  Administered 2017-01-17 (×2): 10 mg via INTRAVENOUS

## 2017-01-17 MED ORDER — FENTANYL CITRATE (PF) 100 MCG/2ML IJ SOLN: 25.0000 ug | INTRAMUSCULAR | Status: DC | PRN

## 2017-01-17 MED ORDER — ONDANSETRON HCL 4 MG/2ML IJ SOLN
INTRAMUSCULAR | Status: AC
  Filled 2017-01-17: qty 2

## 2017-01-17 MED ORDER — BUPIVACAINE-EPINEPHRINE 0.5% -1:200000 IJ SOLN
INTRAMUSCULAR | Status: DC | PRN
  Administered 2017-01-17: 20 mL

## 2017-01-17 MED ORDER — FENTANYL CITRATE (PF) 100 MCG/2ML IJ SOLN
INTRAMUSCULAR | Status: AC
  Filled 2017-01-17: qty 2

## 2017-01-17 MED ORDER — LIDOCAINE HCL (CARDIAC) 20 MG/ML IV SOLN
INTRAVENOUS | Status: DC | PRN
  Administered 2017-01-17: 30 mg via INTRAVENOUS
  Administered 2017-01-17: 70 mg via INTRAVENOUS

## 2017-01-17 MED ORDER — FENTANYL CITRATE (PF) 100 MCG/2ML IJ SOLN
INTRAMUSCULAR | Status: DC | PRN
  Administered 2017-01-17 (×2): 50 ug via INTRAVENOUS

## 2017-01-17 MED ORDER — KETOROLAC TROMETHAMINE 30 MG/ML IJ SOLN
INTRAMUSCULAR | Status: AC
  Filled 2017-01-17: qty 2

## 2017-01-17 MED ORDER — MEPERIDINE HCL 25 MG/ML IJ SOLN: 6.2500 mg | INTRAMUSCULAR | Status: DC | PRN

## 2017-01-17 MED ORDER — DEXAMETHASONE SODIUM PHOSPHATE 10 MG/ML IJ SOLN
INTRAMUSCULAR | Status: DC | PRN
  Administered 2017-01-17: 4 mg via INTRAVENOUS

## 2017-01-17 MED ORDER — BUPIVACAINE-EPINEPHRINE (PF) 0.5% -1:200000 IJ SOLN
INTRAMUSCULAR | Status: AC
  Filled 2017-01-17: qty 30

## 2017-01-17 MED ORDER — MIDAZOLAM HCL 2 MG/2ML IJ SOLN
INTRAMUSCULAR | Status: DC | PRN
  Administered 2017-01-17 (×2): 1 mg via INTRAVENOUS

## 2017-01-17 MED ORDER — OXYCODONE HCL 5 MG PO TABS: 5.0000 mg | ORAL_TABLET | Freq: Once | ORAL | Status: DC | PRN

## 2017-01-17 MED ORDER — KETOROLAC TROMETHAMINE 30 MG/ML IJ SOLN
INTRAMUSCULAR | Status: DC | PRN
  Administered 2017-01-17: 30 mg via INTRAVENOUS
  Administered 2017-01-17: 30 mg via INTRAMUSCULAR

## 2017-01-17 MED ORDER — DEXAMETHASONE SODIUM PHOSPHATE 4 MG/ML IJ SOLN
INTRAMUSCULAR | Status: AC
  Filled 2017-01-17: qty 1

## 2017-01-17 MED ORDER — FERRIC SUBSULFATE 259 MG/GM EX SOLN
CUTANEOUS | Status: AC
  Filled 2017-01-17: qty 8

## 2017-01-17 MED ORDER — ONDANSETRON HCL 4 MG/2ML IJ SOLN
INTRAMUSCULAR | Status: DC | PRN
  Administered 2017-01-17: 4 mg via INTRAVENOUS

## 2017-01-17 MED ORDER — VASOPRESSIN 20 UNIT/ML IV SOLN
INTRAVENOUS | Status: AC
  Filled 2017-01-17: qty 1

## 2017-01-17 MED ORDER — PROMETHAZINE HCL 25 MG/ML IJ SOLN: 6.2500 mg | INTRAMUSCULAR | Status: DC | PRN

## 2017-01-17 MED ORDER — OXYCODONE HCL 5 MG/5ML PO SOLN: 5.0000 mg | Freq: Once | ORAL | Status: DC | PRN

## 2017-01-17 MED ORDER — IODINE STRONG (LUGOLS) 5 % PO SOLN
ORAL | Status: AC
  Filled 2017-01-17: qty 1

## 2017-01-17 SURGICAL SUPPLY — 26 items
APPLICATOR COTTON TIP 6IN STRL (MISCELLANEOUS) IMPLANT
BLADE SURG 11 STRL SS (BLADE) ×3 IMPLANT
CONTAINER PREFILL 10% NBF 60ML (FORM) ×3 IMPLANT
ELECT REM PT RETURN 9FT ADLT (ELECTROSURGICAL) ×3
ELECTRODE REM PT RTRN 9FT ADLT (ELECTROSURGICAL) ×1 IMPLANT
GLOVE BIOGEL PI IND STRL 7.0 (GLOVE) ×1 IMPLANT
GLOVE BIOGEL PI IND STRL 8.5 (GLOVE) ×1 IMPLANT
GLOVE BIOGEL PI INDICATOR 7.0 (GLOVE) ×2
GLOVE BIOGEL PI INDICATOR 8.5 (GLOVE) ×2
GLOVE ECLIPSE 8.0 STRL XLNG CF (GLOVE) ×6 IMPLANT
GOWN STRL REUS W/TWL LRG LVL3 (GOWN DISPOSABLE) ×6 IMPLANT
NS IRRIG 1000ML POUR BTL (IV SOLUTION) ×3 IMPLANT
PACK VAGINAL MINOR WOMEN LF (CUSTOM PROCEDURE TRAY) ×3 IMPLANT
PAD OB MATERNITY 4.3X12.25 (PERSONAL CARE ITEMS) ×3 IMPLANT
PENCIL BUTTON HOLSTER BLD 10FT (ELECTRODE) ×3 IMPLANT
SCOPETTES 8  STERILE (MISCELLANEOUS) ×2
SCOPETTES 8 STERILE (MISCELLANEOUS) ×1 IMPLANT
SPONGE SURGIFOAM ABS GEL 12-7 (HEMOSTASIS) IMPLANT
SUT VIC AB 2-0 CT1 27 (SUTURE) ×4
SUT VIC AB 2-0 CT1 TAPERPNT 27 (SUTURE) ×2 IMPLANT
SUT VICRYL 0 UR6 27IN ABS (SUTURE) ×6 IMPLANT
SYR TB 1ML 25GX5/8 (SYRINGE) IMPLANT
TOWEL OR 17X24 6PK STRL BLUE (TOWEL DISPOSABLE) ×6 IMPLANT
TUBING NON-CON 1/4 X 20 CONN (TUBING) ×2 IMPLANT
TUBING NON-CON 1/4 X 20' CONN (TUBING) ×1
YANKAUER SUCT BULB TIP NO VENT (SUCTIONS) ×3 IMPLANT

## 2017-01-17 NOTE — Progress Notes (Signed)
The patient was interviewed and examined today.  The previously documented history and physical examination was reviewed. There are no changes. The operative procedure was reviewed. The risks and benefits were outlined again. The specific risks include, but are not limited to, anesthetic complications, bleeding, infections, and possible damage to the surrounding organs. The patient's questions were answered.  We are ready to proceed as outlined. The likelihood of the patient achieving the goals of this procedure is very likely.   BP 93/84   Pulse 73   Temp 97.7 F (36.5 C) (Oral)   Resp 16   Ht  (1.575 m)   Wt 60.3 kg (133 lb)   LMP 01/10/2017   SpO2 100%   Breastfeeding? No   BMI 24.33 kg/m   CBC    Component Value Date/Time   WBC 6.8 01/17/2017 0720   RBC 4.55 01/17/2017 0720   HGB 14.4 01/17/2017 0720   HCT 41.9 01/17/2017 0720   PLT 255 01/17/2017 0720   MCV 92.1 01/17/2017 0720   MCH 31.6 01/17/2017 0720   MCHC 34.4 01/17/2017 0720   RDW 11.9 01/17/2017 0720   Results for orders placed or performed during the hospital encounter of 01/17/17 (from the past 24 hour(s))  CBC     Status: None   Collection Time: 01/17/17  7:20 AM  Result Value Ref Range   WBC 6.8 4.0 - 10.5 K/uL   RBC 4.55 3.87 - 5.11 MIL/uL   Hemoglobin 14.4 12.0 - 15.0 g/dL   HCT 16.1 09.6 - 04.5 %   MCV 92.1 78.0 - 100.0 fL   MCH 31.6 26.0 - 34.0 pg   MCHC 34.4 30.0 - 36.0 g/dL   RDW 40.9 81.1 - 91.4 %   Platelets 255 150 - 400 K/uL  Pregnancy, urine     Status: None   Collection Time: 01/17/17  7:20 AM  Result Value Ref Range   Preg Test, Ur NEGATIVE NEGATIVE     Leonard Schwartz, M.D.

## 2017-01-17 NOTE — Transfer of Care (Signed)
Immediate Anesthesia Transfer of Care Note  Patient: Katelyn Warner  Procedure(s) Performed: CONIZATION CERVIX WITH BIOPSY (N/A Vagina )  Patient Location: PACU  Anesthesia Type:MAC  Level of Consciousness: awake, alert , oriented and patient cooperative  Airway & Oxygen Therapy: Patient Spontanous Breathing  Post-op Assessment: Report given to RN and Post -op Vital signs reviewed and stable  Post vital signs: Reviewed and stable  Last Vitals:  Vitals:   01/17/17 0725  BP: 93/84  Pulse: 73  Resp: 16  Temp: 36.5 C  SpO2: 100%    Last Pain:  Vitals:   01/17/17 0725  TempSrc: Oral      Patients Stated Pain Goal: 4 (54/00/86 7619)  Complications: No apparent anesthesia complications

## 2017-01-17 NOTE — Anesthesia Preprocedure Evaluation (Signed)
Anesthesia Evaluation  Patient identified by MRN, date of birth, ID band Patient awake    Reviewed: Allergy & Precautions, NPO status , Patient's Chart, lab work & pertinent test results  Airway Mallampati: II       Dental  (+) Teeth Intact   Pulmonary neg pulmonary ROS,    breath sounds clear to auscultation       Cardiovascular negative cardio ROS   Rhythm:Regular Rate:Normal     Neuro/Psych negative neurological ROS  negative psych ROS   GI/Hepatic negative GI ROS, Neg liver ROS,   Endo/Other  negative endocrine ROS  Renal/GU negative Renal ROS  negative genitourinary   Musculoskeletal negative musculoskeletal ROS (+)   Abdominal   Peds negative pediatric ROS (+)  Hematology negative hematology ROS (+)   Anesthesia Other Findings   Reproductive/Obstetrics (+) Pregnancy                             Lab Results  Component Value Date   WBC 6.8 01/17/2017   HGB 14.4 01/17/2017   HCT 41.9 01/17/2017   MCV 92.1 01/17/2017   PLT 255 01/17/2017   No results found for: CREATININE, BUN, NA, K, CL, CO2 No results found for: INR, PROTIME   Anesthesia Physical  Anesthesia Plan  ASA: II  Anesthesia Plan: MAC   Post-op Pain Management:    Induction:   PONV Risk Score and Plan: 2 and Ondansetron, Dexamethasone and Midazolam  Airway Management Planned:   Additional Equipment:   Intra-op Plan:   Post-operative Plan:   Informed Consent: I have reviewed the patients History and Physical, chart, labs and discussed the procedure including the risks, benefits and alternatives for the proposed anesthesia with the patient or authorized representative who has indicated his/her understanding and acceptance.   Dental advisory given  Plan Discussed with: CRNA  Anesthesia Plan Comments:         Anesthesia Quick Evaluation

## 2017-01-17 NOTE — Op Note (Signed)
Cold Knife Conization Of the Cervix  Charma Mocarski Mcneese  DOB:    09-Feb-1991  MRN:    [id number]  CSN:    [id number]  Date of Surgery:  01/17/2017  Preoperative Diagnosis:  Cervical intraepithelial neoplasia #3  Postoperative Diagnosis:  Cervical intraepithelial neoplasia #3  Procedure:  Cold Knife Conization of the Cervix  Surgeon:  Leonard Schwartz, M D.  Assistant:  None  Anesthetic:  General  Disposition:  The patient presents with a history of CIN-3. She understands the indications for her cold by conization of the cervix as well as the alternative treatment options. She accepts the risk of, but not limited to, anesthetic complications, bleeding, infections, and possible damage to the surrounding organs.  Findings:  The patient had a large nonstaining area on the cervix. The endocervical canal was clear. The uterus was normal size. No adnexal masses were appreciated. There was no parametrial thickening. The uterus sounded to 9 cm.  Procedure:  The patient was taken to the operating room where a general anesthetic was given. A timeout was performed identifying the patient and the procedure. The patient was placed in a lithotomy position. The perineum and vagina were prepped with multiple layers of Betadine. The bladder was drained of urine. The patient was sterilely draped. An examination under anesthesia was performed. A paracervical block was placed using 10 cc of half percent Marcaine with epinephrine. Lugol solution was placed on the cervix. The cervix was then injected with 10 cc of half percent Marcaine with epinephrine. Angle sutures were placed at the 3:00 and 9:00 positions. The endocervix was sounded. A circumferential incision was made around the cervical os incorporating all nonstaining areas from the Lugol solution. The incision was continued in a conelike fashion to the endocervix. A stitch was placed at the 12:00 position of the conization specimen.  The specimen was sent to pathology. Inverting sutures were placed at the 12:00, 3:00, 6:00, and 9:00 positions. Hemostasis was adequate. Examination was repeated. The cervix was firm. All instruments were removed from the vagina. The patient was returned to the supine position. She was awakened from her anesthetic without difficulty. She was transported to the recovery room in stable condition. Sponge and needle counts were correct. The estimated blood loss for the procedure was 75 cc. The conization specimen was sent to pathology.  Followup instructions:  The patient will return to see Dr. Stefano Gaul in 2 weeks for follow-up examination. She will call for questions or concerns. She will call for complications.  Discharge medications:  Ibuprofen 600 mg every 6 hours as needed for mild to moderate pain Percocet one or 2 tablets every 4 hours as needed for severe pain  Leonard Schwartz, M.D.  01/17/2017

## 2017-01-17 NOTE — Discharge Instructions (Signed)
Post Anesthesia Home Care Instructions  NO IBUPROFEN BEFORE 3:30 today  Activity: Get plenty of rest for the remainder of the day. A responsible individual must stay with you for 24 hours following the procedure.  For the next 24 hours, DO NOT: -Drive a car -Advertising copywriter -Drink alcoholic beverages -Take any medication unless instructed by your physician -Make any legal decisions or sign important papers.  Meals: Start with liquid foods such as gelatin or soup. Progress to regular foods as tolerated. Avoid greasy, spicy, heavy foods. If nausea and/or vomiting occur, drink only clear liquids until the nausea and/or vomiting subsides. Call your physician if vomiting continues.  Special Instructions/Symptoms: Your throat may feel dry or sore from the anesthesia or the breathing tube placed in your throat during surgery. If this causes discomfort, gargle with warm salt water. The discomfort should disappear within 24 hours.  If you had a scopolamine patch placed behind your ear for the management of post- operative nausea and/or vomiting:  1. The medication in the patch is effective for 72 hours, after which it should be removed.  Wrap patch in a tissue and discard in the trash. Wash hands thoroughly with soap and water. 2. You may remove the patch earlier than 72 hours if you experience unpleasant side effects which may include dry mouth, dizziness or visual disturbances. 3. Avoid touching the patch. Wash your hands with soap and water after contact with the patch.   DISCHARGE INSTRUCTIONS: D&C / D&E The following instructions have been prepared to help you care for yourself upon your return home.   Personal hygiene:  Use sanitary pads for vaginal drainage, not tampons.  Shower the day after your procedure.  NO tub baths, pools or Jacuzzis for 2-3 weeks.  Wipe front to back after using the bathroom.  Activity and limitations:  Do NOT drive or operate any equipment for 24  hours. The effects of anesthesia are still present and drowsiness may result.  Do NOT rest in bed all day.  Walking is encouraged.  Walk up and down stairs slowly.  You may resume your normal activity in one to two days or as indicated by your physician.  Sexual activity: NO intercourse for at least 2 weeks after the procedure, or as indicated by your physician.  Diet: Eat a light meal as desired this evening. You may resume your usual diet tomorrow.  Return to work: You may resume your work activities in one to two days or as indicated by your doctor.  What to expect after your surgery: Expect to have vaginal bleeding/discharge for 2-3 days and spotting for up to 10 days. It is not unusual to have soreness for up to 1-2 weeks. You may have a slight burning sensation when you urinate for the first day. Mild cramps may continue for a couple of days. You may have a regular period in 2-6 weeks.  Call your doctor for any of the following:  Excessive vaginal bleeding, saturating and changing one pad every hour.  Inability to urinate 6 hours after discharge from hospital.  Pain not relieved by pain medication.  Fever of 100.4 F or greater.  Unusual vaginal discharge or odor.   Call for an appointment:    Patients signature: ______________________  Nurses signature ________________________  Support person's signature_______________________   Cervical Conization, Care After This sheet gives you information about how to care for yourself after your procedure. Your doctor may also give you more specific instructions. If you have problems  or questions, contact your doctor. Follow these instructions at home: Medicines  Take over-the-counter and prescription medicines only as told by your doctor.  Do not take aspirin until your doctor says it is okay.  If you take pain medicine: ? You may have constipation. To help treat this, your doctor may tell you to:  Drink enough fluid  to keep your pee (urine) clear or pale yellow.  Take medicines.  Eat foods that are high in fiber. These include fresh fruits and vegetables, whole grains, bran, and beans.  Limit foods that are high in fat and sugar. These include fried foods and sweet foods. ? Do not drive or use heavy machines. General instructions  You can eat your usual diet unless your doctor tells you not to do so.  Take showers for the first week. Do not take baths, swim, or use hot tubs until your doctor says it is okay.  Do not douche, use tampons, or have sex until your doctor says it is okay.  For 7-14 days after your procedure, avoid: ? Being very active. ? Exercising. ? Heavy lifting.  Keep all follow-up visits as told by your doctor. This is important. Contact a doctor if:  You have a rash.  You are dizzy or lightheaded.  You feel sick to your stomach (nauseous).  You throw up (vomit).  You have fluid from your vagina (vaginal discharge) that smells bad. Get help right away if:  There are blood clots coming from your vagina.  You have more bleeding than you would have in a normal period. For example, you soak a pad in less than 1 hour.  You have a fever.  You have more and more cramps.  You pass out (faint).  You have pain when peeing.  Your have a lot of pain.  Your pain gets worse.  Your pain does not get better when you take your medicine.  You have blood in your pee.  You throw up (vomit). Summary  After your procedure, take over-the-counter and prescription medicines only as told by your doctor.  Do not douche, use tampons, or have sex until your doctor says it is okay.  For about 7-14 days after your procedure, try not to exercise or lift heavy objects.  Get help right away if you have new symptoms, or if your symptoms become worse. This information is not intended to replace advice given to you by your health care provider. Make sure you discuss any questions you  have with your health care provider. Document Released: 01/02/2008 Document Revised: 03/27/2016 Document Reviewed: 03/27/2016 Elsevier Interactive Patient Education  2017 [employer].

## 2017-01-17 NOTE — Anesthesia Postprocedure Evaluation (Signed)
Anesthesia Post Note  Patient: Katelyn Warner  Procedure(s) Performed: CONIZATION CERVIX WITH BIOPSY (N/A Vagina )     Patient location during evaluation: PACU Anesthesia Type: MAC Level of consciousness: awake and alert Pain management: pain level controlled Vital Signs Assessment: post-procedure vital signs reviewed and stable Respiratory status: spontaneous breathing Cardiovascular status: stable Anesthetic complications: no    Last Vitals:  Vitals:   01/17/17 1032 01/17/17 1045  BP: (!) 93/57 (!) 98/58  Pulse: 89 (!) 50  Resp: 20   Temp:  (!) 36.4 C  SpO2: 100% 99%    Last Pain:  Vitals:   01/17/17 0725  TempSrc: Oral   Pain Goal: Patients Stated Pain Goal: 4 (01/17/17 0725)               Nolon Nations

## 2017-01-18 ENCOUNTER — Encounter (HOSPITAL_COMMUNITY): Payer: Self-pay | Admitting: Obstetrics and Gynecology

## 2017-01-31 NOTE — Addendum Note (Signed)
Addendum  created 01/31/17 1052 by Lewie LoronGermeroth, Josefina Rynders, MD   Anesthesia Staff edited
